# Patient Record
Sex: Female | Born: 1970 | Race: White | Hispanic: No | Marital: Married | Smoking: Never smoker
Health system: Southern US, Community
[De-identification: ages and names within clinical notes are randomized; demographics above are authoritative.]

## PROBLEM LIST (undated history)

## (undated) ENCOUNTER — Emergency Department (HOSPITAL_BASED_OUTPATIENT_CLINIC_OR_DEPARTMENT_OTHER): Payer: BC Managed Care – PPO

## (undated) DIAGNOSIS — R002 Palpitations: Secondary | ICD-10-CM

## (undated) DIAGNOSIS — C801 Malignant (primary) neoplasm, unspecified: Secondary | ICD-10-CM

## (undated) HISTORY — PX: BREAST SURGERY: SHX581

## (undated) HISTORY — PX: TONSILLECTOMY: SUR1361

## (undated) HISTORY — PX: AUGMENTATION MAMMAPLASTY: SUR837

## (undated) HISTORY — DX: Palpitations: R00.2

---

## 2008-01-30 ENCOUNTER — Ambulatory Visit: Payer: Self-pay | Admitting: Hematology

## 2008-09-23 ENCOUNTER — Encounter: Admission: RE | Admit: 2008-09-23 | Discharge: 2008-09-23 | Payer: Self-pay | Admitting: Obstetrics and Gynecology

## 2008-09-26 ENCOUNTER — Encounter: Admission: RE | Admit: 2008-09-26 | Discharge: 2008-09-26 | Payer: Self-pay | Admitting: Obstetrics and Gynecology

## 2008-11-21 ENCOUNTER — Emergency Department (HOSPITAL_COMMUNITY): Admission: EM | Admit: 2008-11-21 | Discharge: 2008-11-21 | Payer: Self-pay | Admitting: Emergency Medicine

## 2009-12-17 ENCOUNTER — Encounter: Admission: RE | Admit: 2009-12-17 | Discharge: 2009-12-17 | Payer: Self-pay | Admitting: Obstetrics and Gynecology

## 2010-05-02 ENCOUNTER — Encounter: Payer: Self-pay | Admitting: Obstetrics and Gynecology

## 2010-06-20 IMAGING — MG MM DIGITAL SCREENING IMPLANTS W/ CAD
8 series · 8 of 8 positions shown · non-contrast
Comparison: none

DG SCREENING W/IMPLANTS
Bilateral CC and MLO view(s) were taken.

DIGITAL SCREENING MAMMOGRAM W/IMPLANTS WITH CAD:
Saline implants are present.  Standard and modified compression views are obtained.
The breast tissue is extremely dense.  No dominant masses or malignant type calcifications are 
identified.  Compared with prior studies.
Images were processed with CAD.

[R CC]
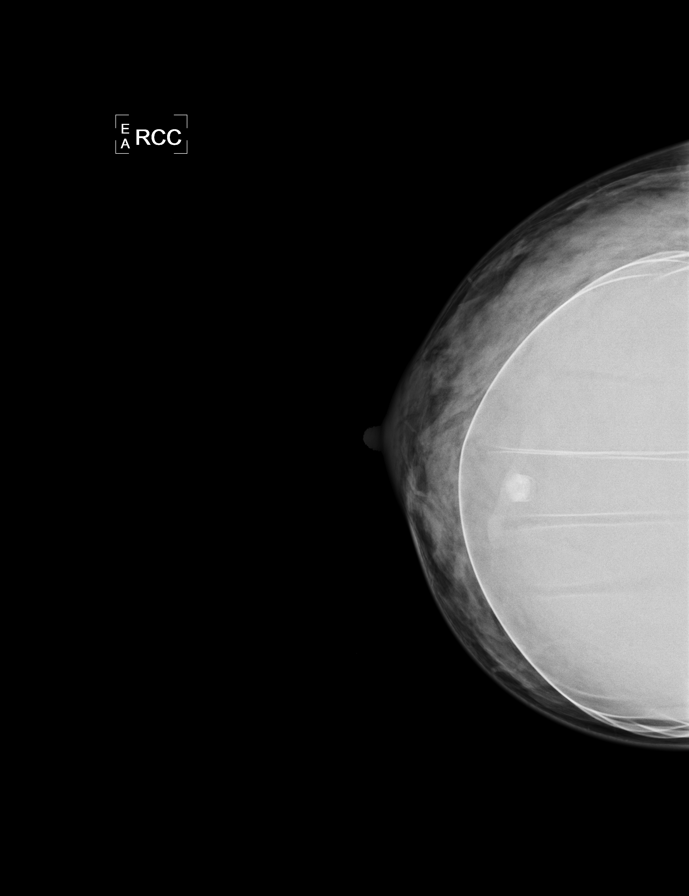

[L CC]
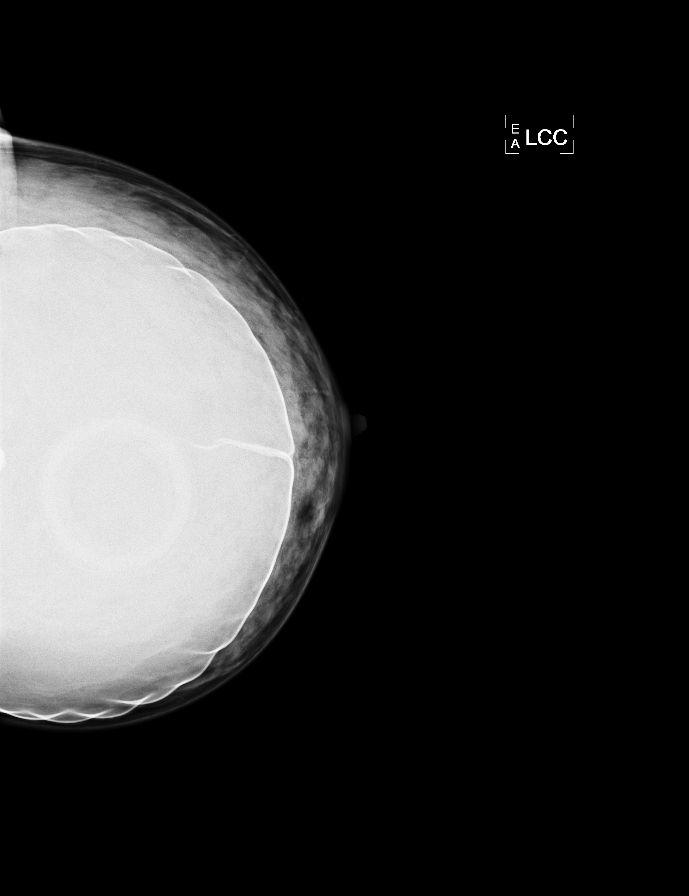

[L MLO]
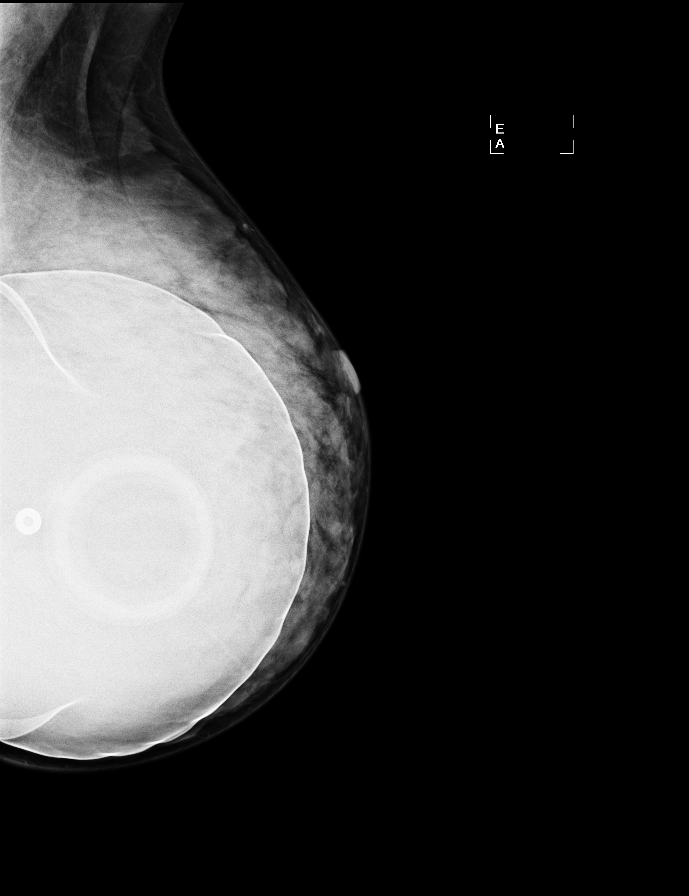

[R MLO]
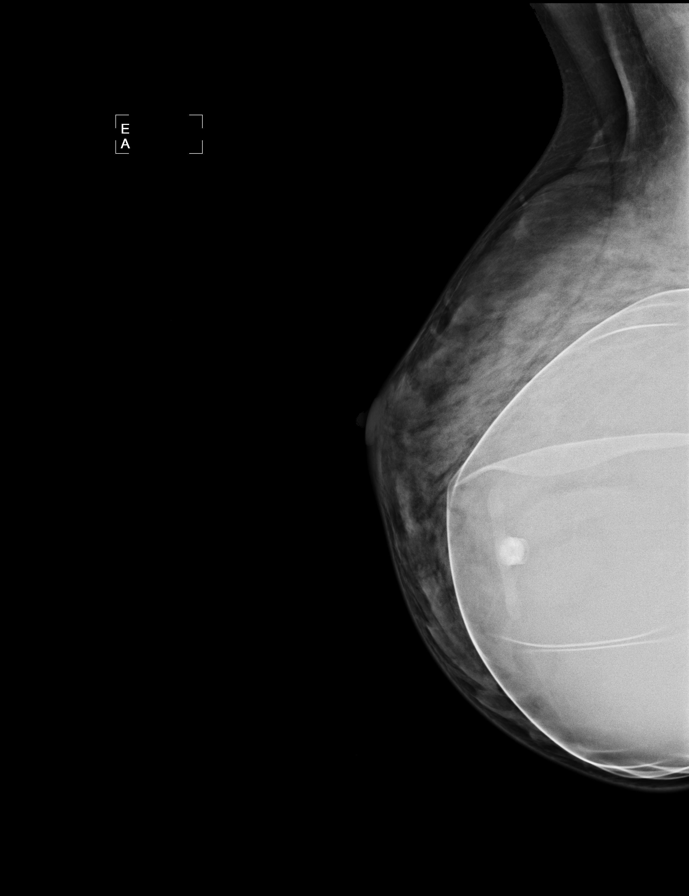

[R CCID]
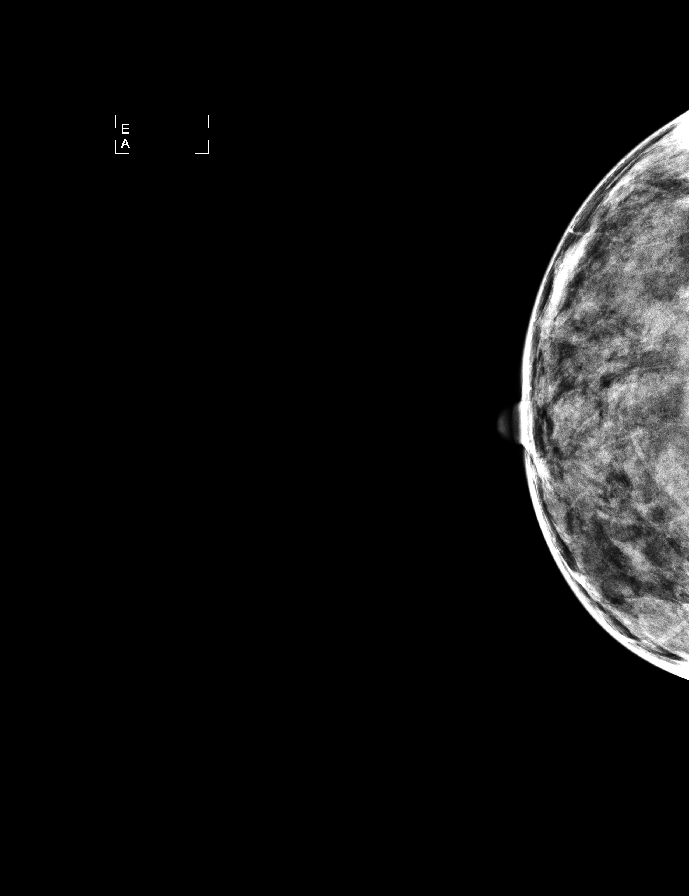

[L CCID]
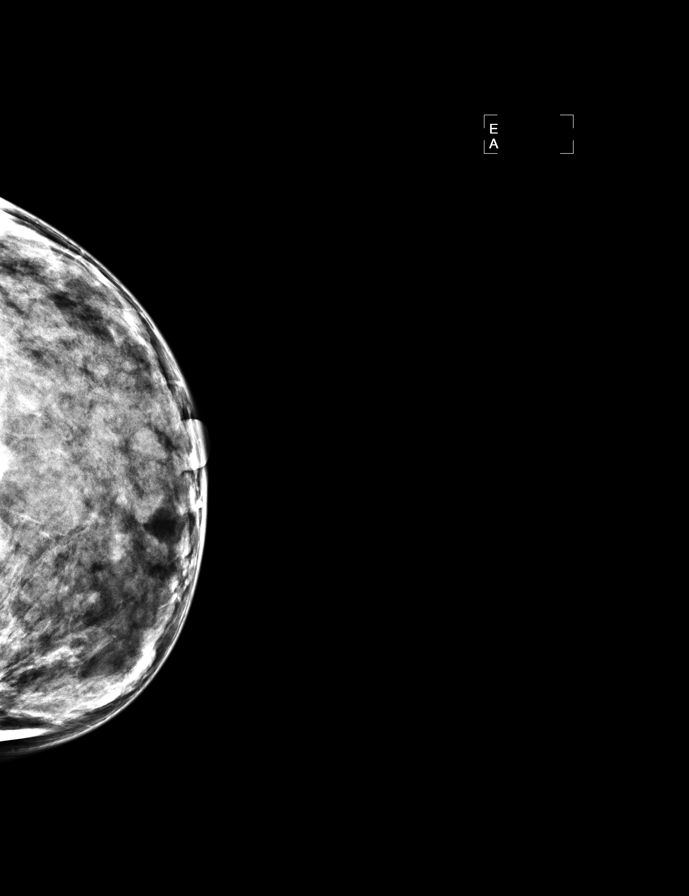

[L MLOID]
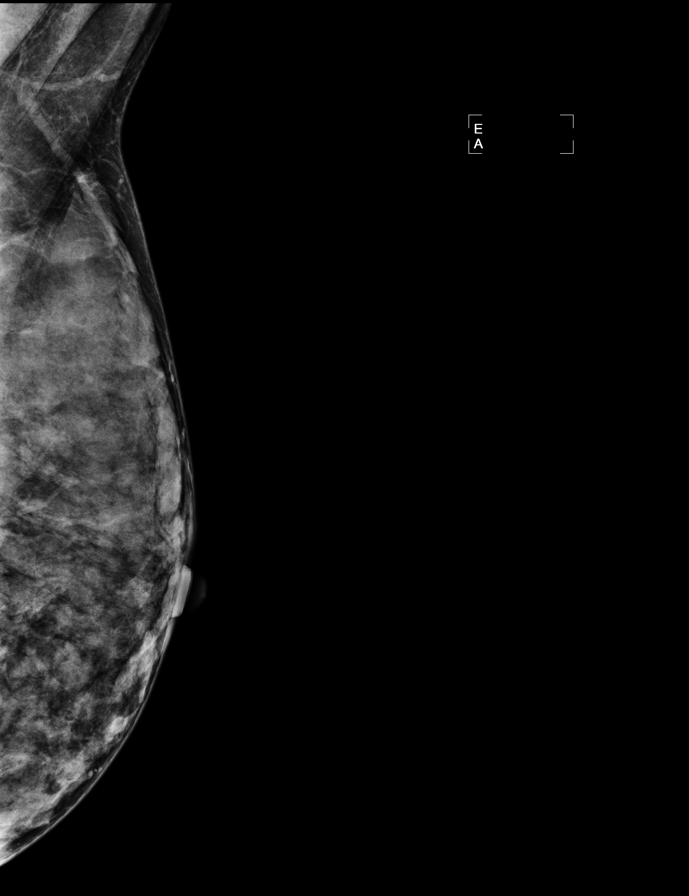

[R MLOID]
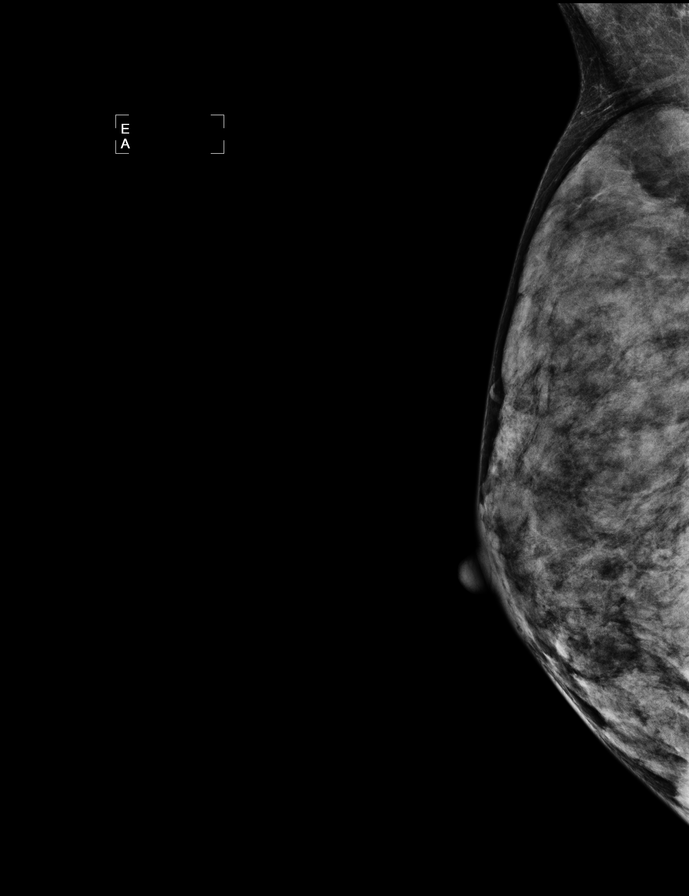

[8 of 8 positions shown; findings below may reference images not displayed]

IMPRESSION: No specific mammographic evidence of malignancy.  Next screening mammogram is recommended in one 
year.

A result letter of this screening mammogram will be mailed directly to the patient.

ASSESSMENT: Negative - BI-RADS 1

Screening mammogram in 1 year.
ANALYZED BY COMPUTER AIDED DETECTION. , THIS PROCEDURE WAS A DIGITAL MAMMOGRAM.

## 2010-07-18 LAB — POCT URINALYSIS DIP (DEVICE)
Bilirubin Urine: NEGATIVE
Nitrite: NEGATIVE
Protein, ur: NEGATIVE mg/dL
Urobilinogen, UA: 0.2 mg/dL (ref 0.0–1.0)
pH: 6.5 (ref 5.0–8.0)

## 2010-12-20 ENCOUNTER — Other Ambulatory Visit: Payer: Self-pay | Admitting: Obstetrics and Gynecology

## 2010-12-20 DIAGNOSIS — Z1231 Encounter for screening mammogram for malignant neoplasm of breast: Secondary | ICD-10-CM

## 2011-01-18 ENCOUNTER — Ambulatory Visit: Payer: Self-pay

## 2011-01-19 ENCOUNTER — Ambulatory Visit
Admission: RE | Admit: 2011-01-19 | Discharge: 2011-01-19 | Disposition: A | Discharge status: Home / Self Care | Payer: BC Managed Care – PPO | Source: Ambulatory Visit | Attending: Obstetrics and Gynecology | Admitting: Obstetrics and Gynecology

## 2011-01-19 DIAGNOSIS — Z1231 Encounter for screening mammogram for malignant neoplasm of breast: Secondary | ICD-10-CM

## 2011-12-19 ENCOUNTER — Other Ambulatory Visit: Payer: Self-pay | Admitting: Obstetrics and Gynecology

## 2011-12-19 DIAGNOSIS — Z1231 Encounter for screening mammogram for malignant neoplasm of breast: Secondary | ICD-10-CM

## 2012-02-07 ENCOUNTER — Ambulatory Visit
Admission: RE | Admit: 2012-02-07 | Discharge: 2012-02-07 | Disposition: A | Discharge status: Home / Self Care | Payer: BC Managed Care – PPO | Source: Ambulatory Visit | Attending: Obstetrics and Gynecology | Admitting: Obstetrics and Gynecology

## 2012-02-07 DIAGNOSIS — Z1231 Encounter for screening mammogram for malignant neoplasm of breast: Secondary | ICD-10-CM

## 2012-07-28 ENCOUNTER — Emergency Department (HOSPITAL_COMMUNITY)
Admission: EM | Admit: 2012-07-28 | Discharge: 2012-07-28 | Disposition: A | Discharge status: Home / Self Care | Payer: BC Managed Care – PPO | Attending: Emergency Medicine | Admitting: Emergency Medicine

## 2012-07-28 ENCOUNTER — Encounter (HOSPITAL_COMMUNITY): Payer: Self-pay | Admitting: *Deleted

## 2012-07-28 DIAGNOSIS — R002 Palpitations: Secondary | ICD-10-CM | POA: Insufficient documentation

## 2012-07-28 DIAGNOSIS — Z3202 Encounter for pregnancy test, result negative: Secondary | ICD-10-CM | POA: Insufficient documentation

## 2012-07-28 DIAGNOSIS — Z79899 Other long term (current) drug therapy: Secondary | ICD-10-CM | POA: Insufficient documentation

## 2012-07-28 DIAGNOSIS — I493 Ventricular premature depolarization: Secondary | ICD-10-CM

## 2012-07-28 DIAGNOSIS — I4949 Other premature depolarization: Secondary | ICD-10-CM | POA: Insufficient documentation

## 2012-07-28 LAB — POCT PREGNANCY, URINE: Preg Test, Ur: NEGATIVE

## 2012-07-28 LAB — CBC WITH DIFFERENTIAL/PLATELET
Basophils Relative: 1 % (ref 0–1)
Eosinophils Absolute: 0 10*3/uL (ref 0.0–0.7)
Eosinophils Relative: 0 % (ref 0–5)
MCH: 30 pg (ref 26.0–34.0)
MCHC: 33.3 g/dL (ref 30.0–36.0)
Neutrophils Relative %: 65 % (ref 43–77)
Platelets: 229 10*3/uL (ref 150–400)
RDW: 13.4 % (ref 11.5–15.5)

## 2012-07-28 LAB — COMPREHENSIVE METABOLIC PANEL
ALT: 18 U/L (ref 0–35)
Albumin: 4 g/dL (ref 3.5–5.2)
Alkaline Phosphatase: 52 U/L (ref 39–117)
Calcium: 9.3 mg/dL (ref 8.4–10.5)
Potassium: 4.1 mEq/L (ref 3.5–5.1)
Sodium: 140 mEq/L (ref 135–145)
Total Protein: 6.7 g/dL (ref 6.0–8.3)

## 2012-07-28 LAB — URINALYSIS, ROUTINE W REFLEX MICROSCOPIC
Glucose, UA: NEGATIVE mg/dL
Ketones, ur: 15 mg/dL — AB
Leukocytes, UA: NEGATIVE
Nitrite: NEGATIVE
Protein, ur: NEGATIVE mg/dL

## 2012-07-28 LAB — MAGNESIUM: Magnesium: 2 mg/dL (ref 1.5–2.5)

## 2012-07-28 LAB — ETHANOL: Alcohol, Ethyl (B): <11 mg/dL (ref 0–11)

## 2012-07-28 MED ORDER — METOPROLOL TARTRATE 12.5 MG HALF TABLET: 12.5000 mg | ORAL_TABLET | Freq: Two times a day (BID) | ORAL | Status: DC

## 2012-07-28 NOTE — ED Notes (Signed)
Pt here via ems for rapid HR.  [employer] originally had HR of 120.  When EMS arrived HR was 95 with occasional pvc's.  Pt non-diaphoretic and denies nausea.  C/o R arm numbness that radiates to r neck along with light-headedness when HR is fast.

## 2012-07-28 NOTE — ED Notes (Signed)
Pt c/o becoming dizzy, and that she is going to faint.  HR increased to 103, but no other vs changed.  No increase in pvc's.

## 2012-07-28 NOTE — ED Provider Notes (Signed)
History     CSN: [id number]  Arrival date & time 07/28/12  1309   First MD Initiated Contact with Patient 07/28/12 1321      Chief Complaint  Patient presents with  . Tachycardia    (Consider location/radiation/quality/duration/timing/severity/associated sxs/prior treatment) HPI Comments: 42 year old female with no significant past medical history presents with a complaint of palpitations. The patient states that this morning she had an increase in her rate to approximately 120, this was checked by paramedics but at that time her pulse was 95 with occasional PVCs. She denies any chest pain nausea vomiting shortness of breath or coughing but does complain of a slight pins and needles sensation between her right shoulder and her right elbow on the medial posterior surface of her upper arm on the right. She denies any weakness of her arms or legs, no abdominal pain, and no fevers chills or diarrhea. She states that she uses several different vitamins from a vitamin company, also uses occasional caffeine and had an increase or alcohol consumption last night about her normal 2 glasses of wine with dinner routine. She denies other stimulant use including cocaine or other illegal substances. At this time her symptoms are intermittent, occurring about every 30-40 seconds, last for several seconds and then resolves. At one point this morning her palpitations became prolonged and gave her a feeling of near syncope but that resolved\ spontaneously and has not recurred again. She has no history of cardiac disease or arrhythmia  The history is provided by the patient and the spouse.    History reviewed. No pertinent past medical history.  Past Surgical History  Procedure Laterality Date  . Cesarean section    . Tonsillectomy    . Breast surgery      implants    No family history on file.  History  Substance Use Topics  . Smoking status: Never Smoker   . Smokeless tobacco: Not on file  .  Alcohol Use: 8.4 oz/week    14 Glasses of wine per week    OB History   Grav Para Term Preterm Abortions TAB SAB Ect Mult Living                  Review of Systems  All other systems reviewed and are negative.    Allergies  Mustard seed  Home Medications   Current Outpatient Rx  Name  Route  Sig  Dispense  Refill  . cycloSPORINE (RESTASIS) 0.05 % ophthalmic emulsion   Both Eyes   Place 1 drop into both eyes daily.         . Multiple Vitamin (MULTIVITAMIN) LIQD   Oral   Take 5 mLs by mouth daily.         . Multiple Vitamins-Minerals (MULTIVITAMIN WITH MINERALS) tablet   Oral   Take 1 tablet by mouth daily. Hair Skin and Nail Vitamin         . PRESCRIPTION MEDICATION   Oral   Take 2 capsules by mouth 2 (two) times daily. Corticare B (Compounded Vitamin from [facility])         . PRESCRIPTION MEDICATION   Intramuscular   Inject 1 vial into the muscle every 30 (thirty) days. Testosterone injection, [facility] at [facility], given at physicians office         . RABEprazole (ACIPHEX) 20 MG tablet   Oral   Take 20 mg by mouth daily.         Marland Kitchen tretinoin (RETIN-A)  0.1 % cream   Topical   Apply 1 application topically at bedtime.         . metoprolol (LOPRESSOR) 12.5 mg TABS   Oral   Take 0.5 tablets (12.5 mg total) by mouth 2 (two) times daily.   60 tablet   1     BP 133/85  Pulse 86  Temp(Src) 98.3 F (36.8 C) (Oral)  Resp 9  Ht 5' 1.5" (1.562 m)  Wt 105 lb (47.628 kg)  BMI 19.52 kg/m2  SpO2 100%  LMP 07/28/2012  Physical Exam  Nursing note and vitals reviewed. Constitutional: She appears well-developed and well-nourished. No distress.  HENT:  Head: Normocephalic and atraumatic.  Mouth/Throat: Oropharynx is clear and moist. No oropharyngeal exudate.  Eyes: Conjunctivae and EOM are normal. Pupils are equal, round, and reactive to light. Right eye exhibits no discharge. Left eye exhibits no discharge. No scleral icterus.   Neck: Normal range of motion. Neck supple. No JVD present. No thyromegaly present.  Cardiovascular: Regular rhythm, normal heart sounds and intact distal pulses.  Exam reveals no gallop and no friction rub.   No murmur heard. Occasional ectopy, strong peripheral pulses at the radial arteries, normal capillary refill, no JVD  Pulmonary/Chest: Effort normal and breath sounds normal. No respiratory distress. She has no wheezes. She has no rales.  Abdominal: Soft. Bowel sounds are normal. She exhibits no distension and no mass. There is no tenderness.  Musculoskeletal: Normal range of motion. She exhibits no edema and no tenderness.  Lymphadenopathy:    She has no cervical adenopathy.  Neurological: She is alert. Coordination normal.  Skin: Skin is warm and dry. No rash noted. No erythema.  Psychiatric: She has a normal mood and affect. Her behavior is normal.    ED Course  Procedures (including critical care time)  Labs Reviewed  COMPREHENSIVE METABOLIC PANEL - Abnormal; Notable for the following:    Glucose, Bld 110 (*)    All other components within normal limits  URINALYSIS, ROUTINE W REFLEX MICROSCOPIC - Abnormal; Notable for the following:    Hgb urine dipstick TRACE (*)    Ketones, ur 15 (*)    All other components within normal limits  URINE MICROSCOPIC-ADD ON - Abnormal; Notable for the following:    Squamous Epithelial / LPF FEW (*)    All other components within normal limits  CBC WITH DIFFERENTIAL  MAGNESIUM  ETHANOL  TSH  POCT PREGNANCY, URINE   No results found.   1. Premature ventricular contractions   2. Palpitations       MDM  *The patient is well-appearing, has no signs of poor perfusion or significant arrhythmia, EKG verifies a single ectopic beat consistent with PVCs otherwise no signs of WPW, no delta wave, the patient does exercise frequently and is able to run as well as engage in weightlifting without any complaints for the majority of her life. Will  check labs, potassium, magnesium, TSH, react monitoring  ED ECG REPORT  I personally interpreted this EKG   Date: 07/28/2012   Rate: EIGHTY ONE  Rhythm: normal sinus rhythm and premature ventricular contractions (PVC)  QRS Axis: normal  Intervals: normal  ST/T Wave abnormalities: normal  Conduction Disutrbances:none  Narrative Interpretation:   Old EKG Reviewed: none available   Laboratory workup unremarkable, patient has not been tachycardic while here, continues to have occasional PVCs, discussed with cardiology who will arrange outpatient followup for the patient and has recommended 12.5 mg of metoprolol twice daily. Patient informed of  treatment plan, stable for discharge.      Vida Roller, MD 07/28/12 [phone]

## 2012-08-02 ENCOUNTER — Encounter (HOSPITAL_COMMUNITY): Payer: Self-pay | Admitting: *Deleted

## 2012-08-02 ENCOUNTER — Emergency Department (HOSPITAL_COMMUNITY)
Admission: EM | Admit: 2012-08-02 | Discharge: 2012-08-02 | Disposition: A | Discharge status: Home / Self Care | Payer: BC Managed Care – PPO | Attending: Emergency Medicine | Admitting: Emergency Medicine

## 2012-08-02 DIAGNOSIS — R002 Palpitations: Secondary | ICD-10-CM

## 2012-08-02 DIAGNOSIS — Z79899 Other long term (current) drug therapy: Secondary | ICD-10-CM | POA: Insufficient documentation

## 2012-08-02 DIAGNOSIS — I4949 Other premature depolarization: Secondary | ICD-10-CM | POA: Insufficient documentation

## 2012-08-02 DIAGNOSIS — E869 Volume depletion, unspecified: Secondary | ICD-10-CM | POA: Insufficient documentation

## 2012-08-02 MED ORDER — SODIUM CHLORIDE 0.9 % IV BOLUS (SEPSIS)
2000.0000 mL | Freq: Once | INTRAVENOUS | Status: AC
  Administered 2012-08-02: 1000 mL via INTRAVENOUS

## 2012-08-02 NOTE — ED Notes (Signed)
Patient with heart palpitations/fluttering.  Patient was seen here a few days ago for same symptoms

## 2012-08-02 NOTE — ED Provider Notes (Signed)
History     CSN: [id number]  Arrival date & time 08/02/12  0757   First MD Initiated Contact with Patient 08/02/12 0800      Chief Complaint  Patient presents with  . Palpitations    The history is provided by the patient and medical records.   patient reports developing palpitations this morning.  She felt as though she had increased palpitations and fluttering when she was walking up stairs in her house and walking around the house.  She does report having 2 glasses of wine last night but this is not out of the ordinary for her.  She was seen emergency room several days ago for similar symptoms.  At that time she had a complete workup by Dr. Hyacinth Meeker including thyroid studies labs and an EKG.  She was started at that time on what sounds like low-dose beta blocker but the patient states it made her feel miserable and therefore she did not continue this.  She has a followup appointment with cardiology however when her symptoms returned today she felt as though his best to come emergency apartment for repeat evaluation.  On arrival to [facility] the patient states she's feeling better and only has occasional palpitations.  The patient is now to have a few premature ventricular contractions while in the emergency department.  There is one captured by EKG.  The patient reports normal oral intake.  She exercises regularly and never has chest pain or palpitations during exercise.  She denies drug use or abuse.  Her thyroid studies were reviewed from her prior ER visit and are normal.  History reviewed. No pertinent past medical history.  Past Surgical History  Procedure Laterality Date  . Cesarean section    . Tonsillectomy    . Breast surgery      implants    History reviewed. No pertinent family history.  History  Substance Use Topics  . Smoking status: Never Smoker   . Smokeless tobacco: Not on file  . Alcohol Use: 8.4 oz/week    14 Glasses of wine per week    OB History   Grav  Para Term Preterm Abortions TAB SAB Ect Mult Living                  Review of Systems  All other systems reviewed and are negative.    Allergies  Mustard seed  Home Medications   Current Outpatient Rx  Name  Route  Sig  Dispense  Refill  . cetirizine-pseudoephedrine (ZYRTEC-D) 5-120 MG per tablet   Oral   Take 1 tablet by mouth daily as needed for allergies.         . cycloSPORINE (RESTASIS) 0.05 % ophthalmic emulsion   Both Eyes   Place 1 drop into both eyes daily as needed (for eyes).          . Multiple Vitamin (MULTIVITAMIN WITH MINERALS) TABS   Oral   Take 1 tablet by mouth daily as needed (for vitamin).         . RABEprazole (ACIPHEX) 20 MG tablet   Oral   Take 20 mg by mouth daily.         Marland Kitchen tretinoin (RETIN-A) 0.1 % cream   Topical   Apply 1 application topically at bedtime as needed (for face).          . metoprolol (LOPRESSOR) 12.5 mg TABS   Oral   Take 0.5 tablets (12.5 mg total) by mouth 2 (two) times daily.  60 tablet   1   . PRESCRIPTION MEDICATION   Intramuscular   Inject 1 vial into the muscle every 30 (thirty) days. Testosterone injection, [facility], given at physicians office           BP 124/69  Pulse 80  Temp(Src) 97.5 F (36.4 C) (Oral)  Resp 17  SpO2 100%  LMP 07/28/2012  Physical Exam  Nursing note and vitals reviewed. Constitutional: She is oriented to person, place, and time. She appears well-developed and well-nourished. No distress.  HENT:  Head: Normocephalic and atraumatic.  Eyes: EOM are normal.  Neck: Normal range of motion.  Cardiovascular: Normal rate, regular rhythm and normal heart sounds.   Pulmonary/Chest: Effort normal and breath sounds normal.  Abdominal: Soft. She exhibits no distension. There is no tenderness.  Musculoskeletal: Normal range of motion.  Neurological: She is alert and oriented to person, place, and time.  Skin: Skin is warm and dry.  Psychiatric: She has a  normal mood and affect. Judgment normal.    ED Course  Procedures (including critical care time)   Date: 08/05/2012  Rate: 73  Rhythm: normal sinus rhythm  QRS Axis: normal  Intervals: normal  ST/T Wave abnormalities: normal  Conduction Disutrbances: single PVC noted  Narrative Interpretation:   Old EKG Reviewed: No significant changes noted     Labs Reviewed - No data to display No results found.   1. Palpitations   2. Volume depletion       MDM  Patient ambulated in the emergency department after ambulating her heart rate went up to around 100 and she had increased number of mature ventricular contractions.  No malignant arrhythmias noted.  Facial be given IV fluids as I suspect she is slightly volume depleted.  I was able to call the cardiology clinic and set up a Holter next week. Follow up with Dr Eden Emms on May 1st which is an earlier visit and she was scheduled for a period  10:08 AM Feels much better walking around after fluids. No PVCs at this time.     Lyanne Co, MD 08/05/12 [phone]

## 2012-08-02 NOTE — ED Notes (Signed)
MD at bedside with patient

## 2012-08-06 ENCOUNTER — Ambulatory Visit (INDEPENDENT_AMBULATORY_CARE_PROVIDER_SITE_OTHER): Payer: BC Managed Care – PPO

## 2012-08-06 DIAGNOSIS — I4949 Other premature depolarization: Secondary | ICD-10-CM

## 2012-08-06 DIAGNOSIS — R002 Palpitations: Secondary | ICD-10-CM

## 2012-08-06 NOTE — Progress Notes (Signed)
Kelsey Archer placed a 48 hr holter

## 2012-08-08 ENCOUNTER — Encounter: Payer: Self-pay | Admitting: *Deleted

## 2012-08-08 ENCOUNTER — Encounter: Payer: Self-pay | Admitting: Cardiovascular Disease

## 2012-08-09 ENCOUNTER — Ambulatory Visit (INDEPENDENT_AMBULATORY_CARE_PROVIDER_SITE_OTHER): Payer: BC Managed Care – PPO | Admitting: Cardiovascular Disease

## 2012-08-09 VITALS — BP 108/78 | HR 77 | Wt 105.0 lb

## 2012-08-09 DIAGNOSIS — R002 Palpitations: Secondary | ICD-10-CM

## 2012-08-09 MED ORDER — PROPRANOLOL HCL 10 MG PO TABS: 10.0000 mg | ORAL_TABLET | Freq: Three times a day (TID) | ORAL | Status: DC

## 2012-08-09 MED ORDER — PROPRANOLOL HCL 10 MG PO TABS: 10.0000 mg | ORAL_TABLET | ORAL | Status: DC | PRN

## 2012-08-09 NOTE — Progress Notes (Signed)
Patient ID: Kelsey Archer, female   DOB: [birth year], 42 y.o.   MRN: [id number] 42 yo seen in ER 4/19 and 4/24 with palpitations.  She felt as though she had increased palpitations and fluttering when she was walking up stairs in her house and walking around the house. She does report having 2 glasses of wine 24th  but this is not out of the ordinary for her. She was seen emergency room several days ago for similar symptoms. At that time she had a complete workup by Dr. Hyacinth Archer including thyroid studies labs and an EKG. She was started at that time on what sounds like low-dose beta blocker but the patient states it made her feel miserable and therefore she did not continue this. She has a followup appointment with cardiology however when her symptoms returned today she felt as though his best to come emergency apartment for repeat evaluation. On arrival to merge pertinent the patient states she's feeling better and only has occasional palpitations. The patient is now to have a few premature ventricular contractions while in the emergency department. There is one captured by EKG. The patient reports normal oral intake. She exercises regularly and never has chest pain or palpitations during exercise. She denies drug use or abuse. Her thyroid studies were reviewed from her prior ER visit and are normal.  Was on testosterone / estrogen type hormonal replacement but stopped Discussed daily wine intake  ROS: Denies fever, malais, weight loss, blurry vision, decreased visual acuity, cough, sputum, SOB, hemoptysis, pleuritic pain, palpitaitons, heartburn, abdominal pain, melena, lower extremity edema, claudication, or rash.  All other systems reviewed and negative   General: Affect appropriate Healthy:  appears stated age HEENT: normal Neck supple with no adenopathy JVP normal no bruits no thyromegaly Lungs clear with no wheezing and good diaphragmatic motion Heart:  S1/S2 no murmur,rub, gallop or click PMI  normal Abdomen: benighn, BS positve, no tenderness, no AAA no bruit.  No HSM or HJR Distal pulses intact with no bruits No edema Neuro non-focal Skin warm and dry No muscular weakness  Medications Current Outpatient Prescriptions  Medication Sig Dispense Refill  . cetirizine-pseudoephedrine (ZYRTEC-D) 5-120 MG per tablet Take 1 tablet by mouth daily as needed for allergies.      . RABEprazole (ACIPHEX) 20 MG tablet Take 20 mg by mouth daily.      Marland Kitchen tretinoin (RETIN-A) 0.1 % cream Apply 1 application topically at bedtime as needed (for face).        No current facility-administered medications for this visit.    Allergies Mustard seed  Family History: No family history on file.  Social History: History   Social History  . Marital Status: Married    Spouse Name: N/A    Number of Children: N/A  . Years of Education: N/A   Occupational History  . Not on file.   Social History Main Topics  . Smoking status: Never Smoker   . Smokeless tobacco: Not on file  . Alcohol Use: 8.4 oz/week    14 Glasses of wine per week  . Drug Use: No  . Sexually Active: Not on file   Other Topics Concern  . Not on file   Social History Narrative  . No narrative on file    Electrocardiogram:  07/28/12 SR rate 81 normal QT 392  Assessment and Plan

## 2012-08-09 NOTE — Patient Instructions (Signed)
Your physician recommends that you schedule a follow-up appointment in  AS NEEDED  Your physician has recommended you make the following change in your medication:  ADD INDERAL 10 MG 1 TAB AS NEEDED FOR PALPITATIONS  Your physician has requested that you have a stress echocardiogram. For further information please visit [email or web address] Please follow instruction sheet as given.

## 2012-08-09 NOTE — Assessment & Plan Note (Signed)
Likely benign No high risk features. F/U stress echo and will review monitor when available. PRN inderal 5 mg called in.   Ok to exercise.  Stress management discussed.  Wine intake discussed

## 2012-08-21 ENCOUNTER — Other Ambulatory Visit (HOSPITAL_COMMUNITY): Payer: BC Managed Care – PPO

## 2012-08-21 ENCOUNTER — Institutional Professional Consult (permissible substitution): Payer: BC Managed Care – PPO | Admitting: Cardiology

## 2012-08-31 ENCOUNTER — Encounter: Payer: Self-pay | Admitting: Cardiovascular Disease

## 2012-08-31 ENCOUNTER — Ambulatory Visit (HOSPITAL_COMMUNITY): Payer: BC Managed Care – PPO | Attending: Cardiology

## 2012-08-31 ENCOUNTER — Ambulatory Visit (HOSPITAL_BASED_OUTPATIENT_CLINIC_OR_DEPARTMENT_OTHER): Payer: BC Managed Care – PPO | Admitting: Radiology

## 2012-08-31 DIAGNOSIS — R079 Chest pain, unspecified: Secondary | ICD-10-CM | POA: Insufficient documentation

## 2012-08-31 DIAGNOSIS — R002 Palpitations: Secondary | ICD-10-CM | POA: Insufficient documentation

## 2012-08-31 DIAGNOSIS — R0989 Other specified symptoms and signs involving the circulatory and respiratory systems: Secondary | ICD-10-CM

## 2012-08-31 DIAGNOSIS — R55 Syncope and collapse: Secondary | ICD-10-CM | POA: Insufficient documentation

## 2012-08-31 NOTE — Progress Notes (Signed)
Stress Echocardiogram performed.  

## 2012-09-04 ENCOUNTER — Telehealth: Payer: Self-pay | Admitting: Nurse Practitioner

## 2012-09-04 NOTE — Telephone Encounter (Signed)
I found patient's holter monitor results which Dr. Eden Emms had signed off on and which show SR with isolated PVC's; no significant arrhythmia.  Patient verbalized understanding. After reporting results to patient I placed the report in Holter Room box.

## 2013-01-08 ENCOUNTER — Other Ambulatory Visit: Payer: Self-pay

## 2013-01-08 DIAGNOSIS — Z1231 Encounter for screening mammogram for malignant neoplasm of breast: Secondary | ICD-10-CM

## 2013-01-23 ENCOUNTER — Encounter (HOSPITAL_COMMUNITY): Payer: Self-pay | Admitting: Emergency Medicine

## 2013-01-23 ENCOUNTER — Emergency Department (HOSPITAL_COMMUNITY)
Admission: EM | Admit: 2013-01-23 | Discharge: 2013-01-24 | Disposition: A | Discharge status: Home / Self Care | Payer: BC Managed Care – PPO | Attending: Emergency Medicine | Admitting: Emergency Medicine

## 2013-01-23 ENCOUNTER — Emergency Department (HOSPITAL_COMMUNITY): Payer: BC Managed Care – PPO

## 2013-01-23 DIAGNOSIS — R1110 Vomiting, unspecified: Secondary | ICD-10-CM | POA: Insufficient documentation

## 2013-01-23 DIAGNOSIS — Z3202 Encounter for pregnancy test, result negative: Secondary | ICD-10-CM | POA: Insufficient documentation

## 2013-01-23 DIAGNOSIS — R002 Palpitations: Secondary | ICD-10-CM | POA: Insufficient documentation

## 2013-01-23 DIAGNOSIS — Z79899 Other long term (current) drug therapy: Secondary | ICD-10-CM | POA: Insufficient documentation

## 2013-01-23 DIAGNOSIS — R55 Syncope and collapse: Secondary | ICD-10-CM | POA: Insufficient documentation

## 2013-01-23 DIAGNOSIS — R0789 Other chest pain: Secondary | ICD-10-CM | POA: Insufficient documentation

## 2013-01-23 DIAGNOSIS — R42 Dizziness and giddiness: Secondary | ICD-10-CM | POA: Insufficient documentation

## 2013-01-23 DIAGNOSIS — R61 Generalized hyperhidrosis: Secondary | ICD-10-CM | POA: Insufficient documentation

## 2013-01-23 LAB — BASIC METABOLIC PANEL
CO2: 26 mEq/L (ref 19–32)
Chloride: 102 mEq/L (ref 96–112)
Glucose, Bld: 117 mg/dL — ABNORMAL HIGH (ref 70–99)
Potassium: 4 mEq/L (ref 3.5–5.1)
Sodium: 137 mEq/L (ref 135–145)

## 2013-01-23 LAB — CBC
Hemoglobin: 12.2 g/dL (ref 12.0–15.0)
MCH: 30.6 pg (ref 26.0–34.0)
RBC: 3.99 MIL/uL (ref 3.87–5.11)

## 2013-01-23 LAB — POCT I-STAT TROPONIN I

## 2013-01-23 MED ORDER — SODIUM CHLORIDE 0.9 % IV BOLUS (SEPSIS)
1000.0000 mL | Freq: Once | INTRAVENOUS | Status: AC
  Administered 2013-01-24: 1000 mL via INTRAVENOUS

## 2013-01-23 NOTE — ED Provider Notes (Signed)
TIME SEEN: 11:54 PM  CHIEF COMPLAINT: Chest pain, palpitations, near syncope  HPI: Patient is a 42 year old female with no significant past medical history presents emergency department with 3 days of intermittent chest pressure, palpitations and lightheadedness. She denies any aggravating or alleviating factors. She has had one episode of vomiting and one episode of diaphoresis. She had similar symptoms approximately one year ago was seen by a cardiologist and had a normal stress test, echocardiogram and/or Holter monitor without any events. She denies that she's had any fever, cough or shortness of breath, lower extremity swelling or pain. No prior history of PE or DVT. No recent hospitalization, prolonged immobilization, fracture, surgery, trauma. Patient is on exogenous progesterone but not on estrogens. No history of hypertension, diabetes, hyperlipidemia, tobacco, family history of premature cardiac disease.  ROS: See HPI Constitutional: no fever  Eyes: no drainage  ENT: no runny nose   Cardiovascular:  chest pain  Resp: no SOB  GI: no vomiting GU: no dysuria Integumentary: no rash  Allergy: no hives  Musculoskeletal: no leg swelling  Neurological: no slurred speech ROS otherwise negative  PAST MEDICAL HISTORY/PAST SURGICAL HISTORY:  Past Medical History  Diagnosis Date  . Palpitations     MEDICATIONS:  Prior to Admission medications   Medication Sig Start Date End Date Taking? Authorizing Provider  cetirizine-pseudoephedrine (ZYRTEC-D) 5-120 MG per tablet Take 1 tablet by mouth daily as needed for allergies.   Yes Historical Provider, MD  Misc Natural Products (PROGESTERONE EX) Apply 1 application topically daily.   Yes Historical Provider, MD  progesterone (PROMETRIUM) 100 MG capsule Take 100 mg by mouth daily.   Yes Historical Provider, MD  RABEprazole (ACIPHEX) 20 MG tablet Take 20 mg by mouth daily.   Yes Historical Provider, MD    ALLERGIES:  Allergies  Allergen  Reactions  . Mustard Seed Nausea And Vomiting    SOCIAL HISTORY:  History  Substance Use Topics  . Smoking status: Never Smoker   . Smokeless tobacco: Not on file  . Alcohol Use: 8.4 oz/week    14 Glasses of wine per week    FAMILY HISTORY: No family history on file.  EXAM: BP 118/73  Pulse 83  Temp(Src) 98 F (36.7 C) (Oral)  Resp 15  Ht 5\' 1"  (1.549 m)  Wt 105 lb (47.628 kg)  BMI 19.85 kg/m2  SpO2 100%  LMP 01/08/2013 CONSTITUTIONAL: Alert and oriented and responds appropriately to questions. Well-appearing; well-nourished HEAD: Normocephalic EYES: Conjunctivae clear, PERRL ENT: normal nose; no rhinorrhea; moist mucous membranes; pharynx without lesions noted NECK: Supple, no meningismus, no LAD  CARD: RRR; S1 and S2 appreciated; no murmurs, no clicks, no rubs, no gallops RESP: Normal chest excursion without splinting or tachypnea; breath sounds clear and equal bilaterally; no wheezes, no rhonchi, no rales,  ABD/GI: Normal bowel sounds; non-distended; soft, non-tender, no rebound, no guarding BACK:  The back appears normal and is non-tender to palpation, there is no CVA tenderness EXT: Normal ROM in all joints; non-tender to palpation; no edema; normal capillary refill; no cyanosis    SKIN: Normal color for age and race; warm NEURO: Moves all extremities equally PSYCH: The patient's mood and manner are appropriate. Grooming and personal hygiene are appropriate.  MEDICAL DECISION MAKING: Patient here with lightheadedness, palpitations and chest tightness. She has no risk factors for ACS or pulmonary embolus. Her vital signs are stable. Her EKG is completely normal other than a slightly short PR interval but there is no delta wave to suggest  WPW. Her electrolytes, blood counts are normal. Will give IV fluids, check orthostatic vital signs and reassess. Have offered admission the patient reports that she would like outpatient followup. We'll check second troponin.  ED  PROGRESS: Second troponin pending. Patient reports feeling much better after IV fluids. Orthostatic vital signs negative.   2:22 AM  Pt troponin is negative. She still hemodynamically stable and is now asymptomatic and feels much better. Have offered admission but patient reports she would like discharge home. Given strict return precautions. Patient and husband verbalize understanding and are comfortable with plan.  Layla Maw Alecia Doi, DO 01/24/13 [phone]

## 2013-01-23 NOTE — ED Notes (Signed)
Pt. reports intermittent mid / upper chest pain " tightness" with nausea and emesis onset Sunday this week with palpitations .

## 2013-01-24 LAB — URINALYSIS, ROUTINE W REFLEX MICROSCOPIC
Leukocytes, UA: NEGATIVE
Nitrite: NEGATIVE
Specific Gravity, Urine: 1.01 (ref 1.005–1.030)
pH: 7 (ref 5.0–8.0)

## 2013-01-24 LAB — POCT I-STAT TROPONIN I: Troponin i, poc: 0.01 ng/mL (ref 0.00–0.08)

## 2013-01-24 MED ORDER — SODIUM CHLORIDE 0.9 % IV BOLUS (SEPSIS)
1000.0000 mL | Freq: Once | INTRAVENOUS | Status: AC
  Administered 2013-01-24: 1000 mL via INTRAVENOUS

## 2013-01-24 NOTE — ED Provider Notes (Signed)
Date: 01/23/13 21:39  Rate: 90  Rhythm: normal sinus rhythm; no ectopy  QRS Axis: normal  Intervals: normal  ST/T Wave abnormalities: normal  Conduction Disutrbances: Short PR interval  Narrative Interpretation: unremarkable; short PR interval, no delta wave, no Brugada, no other arrhythmia      Layla Maw Tiajuana Leppanen, DO 01/24/13 0102

## 2013-02-07 ENCOUNTER — Ambulatory Visit
Admission: RE | Admit: 2013-02-07 | Discharge: 2013-02-07 | Disposition: A | Discharge status: Home / Self Care | Payer: BC Managed Care – PPO | Source: Ambulatory Visit

## 2013-02-07 DIAGNOSIS — Z1231 Encounter for screening mammogram for malignant neoplasm of breast: Secondary | ICD-10-CM

## 2014-01-14 ENCOUNTER — Other Ambulatory Visit: Payer: Self-pay

## 2014-01-14 DIAGNOSIS — Z1231 Encounter for screening mammogram for malignant neoplasm of breast: Secondary | ICD-10-CM

## 2014-06-09 ENCOUNTER — Ambulatory Visit
Admission: RE | Admit: 2014-06-09 | Discharge: 2014-06-09 | Disposition: A | Discharge status: Home / Self Care | Payer: BLUE CROSS/BLUE SHIELD | Source: Ambulatory Visit

## 2014-06-09 DIAGNOSIS — Z1231 Encounter for screening mammogram for malignant neoplasm of breast: Secondary | ICD-10-CM

## 2014-11-24 ENCOUNTER — Ambulatory Visit: Payer: BLUE CROSS/BLUE SHIELD | Admitting: Sports Medicine

## 2015-05-21 ENCOUNTER — Other Ambulatory Visit: Payer: Self-pay

## 2015-05-21 DIAGNOSIS — Z1231 Encounter for screening mammogram for malignant neoplasm of breast: Secondary | ICD-10-CM

## 2015-06-16 ENCOUNTER — Ambulatory Visit
Admission: RE | Admit: 2015-06-16 | Discharge: 2015-06-16 | Disposition: A | Discharge status: Home / Self Care | Payer: BLUE CROSS/BLUE SHIELD | Source: Ambulatory Visit

## 2015-06-16 DIAGNOSIS — Z1231 Encounter for screening mammogram for malignant neoplasm of breast: Secondary | ICD-10-CM

## 2015-06-17 ENCOUNTER — Other Ambulatory Visit: Payer: Self-pay | Admitting: Obstetrics and Gynecology

## 2015-06-17 DIAGNOSIS — Z9189 Other specified personal risk factors, not elsewhere classified: Secondary | ICD-10-CM

## 2015-07-07 ENCOUNTER — Ambulatory Visit
Admission: RE | Admit: 2015-07-07 | Discharge: 2015-07-07 | Disposition: A | Discharge status: Home / Self Care | Payer: BLUE CROSS/BLUE SHIELD | Source: Ambulatory Visit | Attending: Obstetrics and Gynecology | Admitting: Obstetrics and Gynecology

## 2015-07-07 DIAGNOSIS — Z9189 Other specified personal risk factors, not elsewhere classified: Secondary | ICD-10-CM

## 2015-07-07 MED ORDER — GADOBENATE DIMEGLUMINE 529 MG/ML IV SOLN
9.0000 mL | Freq: Once | INTRAVENOUS | Status: AC | PRN
  Administered 2015-07-07: 9 mL via INTRAVENOUS

## 2015-10-02 ENCOUNTER — Encounter: Payer: Self-pay | Admitting: Genetic Counselor

## 2016-01-13 ENCOUNTER — Emergency Department (HOSPITAL_COMMUNITY)
Admission: EM | Admit: 2016-01-13 | Discharge: 2016-01-13 | Disposition: A | Discharge status: Home / Self Care | Payer: BLUE CROSS/BLUE SHIELD | Attending: Emergency Medicine | Admitting: Emergency Medicine

## 2016-01-13 ENCOUNTER — Encounter (HOSPITAL_COMMUNITY): Payer: Self-pay | Admitting: Emergency Medicine

## 2016-01-13 DIAGNOSIS — S60511A Abrasion of right hand, initial encounter: Secondary | ICD-10-CM

## 2016-01-13 DIAGNOSIS — Y999 Unspecified external cause status: Secondary | ICD-10-CM | POA: Insufficient documentation

## 2016-01-13 DIAGNOSIS — S50811A Abrasion of right forearm, initial encounter: Secondary | ICD-10-CM | POA: Insufficient documentation

## 2016-01-13 DIAGNOSIS — Y9289 Other specified places as the place of occurrence of the external cause: Secondary | ICD-10-CM | POA: Diagnosis not present

## 2016-01-13 DIAGNOSIS — W5503XA Scratched by cat, initial encounter: Secondary | ICD-10-CM | POA: Diagnosis not present

## 2016-01-13 DIAGNOSIS — Y939 Activity, unspecified: Secondary | ICD-10-CM | POA: Diagnosis not present

## 2016-01-13 DIAGNOSIS — Z23 Encounter for immunization: Secondary | ICD-10-CM | POA: Diagnosis not present

## 2016-01-13 DIAGNOSIS — S61451A Open bite of right hand, initial encounter: Secondary | ICD-10-CM | POA: Diagnosis present

## 2016-01-13 MED ORDER — TETANUS-DIPHTH-ACELL PERTUSSIS 5-2.5-18.5 LF-MCG/0.5 IM SUSP
0.5000 mL | Freq: Once | INTRAMUSCULAR | Status: AC
  Administered 2016-01-13: 0.5 mL via INTRAMUSCULAR
  Filled 2016-01-13: qty 0.5

## 2016-01-13 NOTE — ED Notes (Signed)
Pt is in stable condition upon d/c and ambulates from ED. 

## 2016-01-13 NOTE — ED Provider Notes (Signed)
[facility] DEPT Provider Note   CSN: [id number] Arrival date & time: 01/13/16  1412     History   Chief Complaint Chief Complaint  Patient presents with  . Animal Bite    HPI Kelsey Archer is a 45 y.o. female.  The history is provided by the patient. No language interpreter was used.  Animal Bite  Contact animal:  Cat Location:  Hand Time since incident:  1 day Pain details:    Quality:  Aching   Severity:  Mild   Timing:  Constant   Progression:  Worsening Incident location:  Home Provoked: provoked   Notifications:  Animal control Animal's rabies vaccination status:  Out of date Animal in possession: yes   Relieved by:  Nothing Worsened by:  Nothing Ineffective treatments:  None tried Associated symptoms: no rash    Pt complains of a scratch or possible bite to arm from a cat bite  Past Medical History:  Diagnosis Date  . Palpitations     Patient Active Problem List   Diagnosis Date Noted  . Palpitation 08/06/2012    Past Surgical History:  Procedure Laterality Date  . BREAST SURGERY     implants  . CESAREAN SECTION    . TONSILLECTOMY      OB History    No data available       Home Medications    Prior to Admission medications   Medication Sig Start Date End Date Taking? Authorizing Provider  cetirizine-pseudoephedrine (ZYRTEC-D) 5-120 MG per tablet Take 1 tablet by mouth daily as needed for allergies.    Historical Provider, MD  Misc Natural Products (PROGESTERONE EX) Apply 1 application topically daily.    Historical Provider, MD  progesterone (PROMETRIUM) 100 MG capsule Take 100 mg by mouth daily.    Historical Provider, MD  RABEprazole (ACIPHEX) 20 MG tablet Take 20 mg by mouth daily.    Historical Provider, MD    Family History No family history on file.  Social History Social History  Substance Use Topics  . Smoking status: Never Smoker  . Smokeless tobacco: Not on file  . Alcohol use 8.4 oz/week    14 Glasses of wine  per week     Allergies   Mustard seed   Review of Systems Review of Systems  Skin: Negative for rash.  All other systems reviewed and are negative.    Physical Exam Updated Vital Signs BP 144/77 (BP Location: Right Arm)   Pulse 101   Temp 98.5 F (36.9 C) (Oral)   LMP 01/08/2013   SpO2 99%   Physical Exam  Constitutional: She is oriented to person, place, and time. She appears well-developed and well-nourished.  HENT:  Head: Normocephalic.  Eyes: EOM are normal.  Neck: Normal range of motion.  Pulmonary/Chest: Effort normal.  Abdominal: She exhibits no distension.  Musculoskeletal: Normal range of motion.  Abrasion right forearm, nv and ns intact  Neurological: She is alert and oriented to person, place, and time.  Psychiatric: She has a normal mood and affect.  Nursing note and vitals reviewed.    ED Treatments / Results  Labs (all labs ordered are listed, but only abnormal results are displayed) Labs Reviewed - No data to display  EKG  EKG Interpretation None       Radiology No results found.  Procedures Procedures (including critical care time)  Medications Ordered in ED Medications  Tdap (BOOSTRIX) injection 0.5 mL (not administered)     Initial Impression / Assessment and  Plan / ED Course  I have reviewed the triage vital signs and the nursing notes.  Pertinent labs & imaging results that were available during my care of the patient were reviewed by me and considered in my medical decision making (see chart for details).  Clinical Course   Animal control has cat.  I explained to pt she does not need shots unless cat becomes sick.    Final Clinical Impressions(s) / ED Diagnoses   Final diagnoses:  Cat scratch of right hand, initial encounter    New Prescriptions New Prescriptions   No medications on file     Fransico Meadow, PA-C 01/13/16 [place], MD 01/18/16 [phone]

## 2016-01-13 NOTE — ED Triage Notes (Signed)
Pt has small laceration/abrasion to left wrist--from cat-- barn cat-- not sure if it is up to date on shots. No swelling.

## 2016-03-18 ENCOUNTER — Ambulatory Visit (INDEPENDENT_AMBULATORY_CARE_PROVIDER_SITE_OTHER): Payer: BLUE CROSS/BLUE SHIELD | Admitting: Orthopaedic Surgery

## 2016-03-18 ENCOUNTER — Encounter (INDEPENDENT_AMBULATORY_CARE_PROVIDER_SITE_OTHER): Payer: Self-pay | Admitting: Orthopaedic Surgery

## 2016-03-18 ENCOUNTER — Ambulatory Visit (INDEPENDENT_AMBULATORY_CARE_PROVIDER_SITE_OTHER): Payer: Self-pay

## 2016-03-18 DIAGNOSIS — M25511 Pain in right shoulder: Secondary | ICD-10-CM

## 2016-03-18 NOTE — Progress Notes (Signed)
Office Visit Note   Patient: Kelsey Archer           Date of Birth: 01/27/1971           MRN: [id number] Visit Date: 03/18/2016              Requested by: Robyne Peers, MD [id number] [place], [place] [address] PCP: Robyne Peers., MD   Assessment & Plan: Visit Diagnoses:  1. Acute pain of right shoulder     Plan: Impression is right pectoralis minor tendinitis versus strain. Recommend continue rest for 4 weeks anti-inflammatories as needed follow-up as needed.  Follow-Up Instructions: Return if symptoms worsen or fail to improve.   Orders:  Orders Placed This Encounter  Procedures  . XR Shoulder Right   No orders of the defined types were placed in this encounter.     Procedures: No procedures performed   Clinical Data: No additional findings.   Subjective: Chief Complaint  Patient presents with  . Right Shoulder - Pain    Kelsey Archer is a 45 year old female who is well-known to me. She comes in with 3 week history of right shoulder pain. She started having pain after working out and doing planks. The pain has gotten better. She does have some weakness with shoulder retraction and pushups. The pain is right around the coracoid and infraclavicular region. She denies any chest pain shortness of breath or pleuritic chest pain. She denies any previous history of DVT. Denies any clicking or trauma.    Review of Systems  Constitutional: Negative.   HENT: Negative.   Eyes: Negative.   Respiratory: Negative.   Cardiovascular: Negative.   Endocrine: Negative.   Musculoskeletal: Negative.   Neurological: Negative.   Hematological: Negative.   Psychiatric/Behavioral: Negative.   All other systems reviewed and are negative.    Objective: Vital Signs: LMP 01/08/2013   Physical Exam  Constitutional: She is oriented to person, place, and time. She appears well-developed and well-nourished.  HENT:  Head: Atraumatic.  Eyes: EOM are normal.    Neck: Neck supple.  Cardiovascular: Intact distal pulses.   Pulmonary/Chest: Effort normal.  Abdominal: Soft.  Neurological: She is alert and oriented to person, place, and time.  Skin: Skin is warm. Capillary refill takes less than 2 seconds.  Psychiatric: She has a normal mood and affect. Her behavior is normal. Judgment and thought content normal.  Nursing note and vitals reviewed.   Ortho Exam Exam of the right shoulder region shows normal range of motion and rotator cuff strength in testing. She is slightly tender to palpation over the coracoid. She is neurovascularly intact distally. No asymmetry in the axillary fold. Specialty Comments:  No specialty comments available.  Imaging: Xr Shoulder Right  Result Date: 03/18/2016 No acute abnormalities.    PMFS History: Patient Active Problem List   Diagnosis Date Noted  . Palpitation 08/06/2012   Past Medical History:  Diagnosis Date  . Palpitations     No family history on file.  Past Surgical History:  Procedure Laterality Date  . BREAST SURGERY     implants  . CESAREAN SECTION    . TONSILLECTOMY     Social History   Occupational History  . Not on file.   Social History Main Topics  . Smoking status: Never Smoker  . Smokeless tobacco: Not on file  . Alcohol use 8.4 oz/week    14 Glasses of wine per week  . Drug use: No  .  Sexual activity: Not on file

## 2016-06-14 ENCOUNTER — Other Ambulatory Visit (INDEPENDENT_AMBULATORY_CARE_PROVIDER_SITE_OTHER): Payer: Self-pay | Admitting: Orthopaedic Surgery

## 2016-06-14 MED ORDER — TIZANIDINE HCL 2 MG PO TABS: 2.0000 mg | ORAL_TABLET | Freq: Three times a day (TID) | ORAL | 0 refills | Status: DC | PRN

## 2016-06-30 ENCOUNTER — Telehealth (INDEPENDENT_AMBULATORY_CARE_PROVIDER_SITE_OTHER): Payer: Self-pay | Admitting: *Deleted

## 2016-06-30 NOTE — Telephone Encounter (Signed)
Todd calling from [facility] to confirm if a PA has been received.

## 2016-06-30 NOTE — Telephone Encounter (Signed)
Called Todd to let him know we have not received form. he will re fax form

## 2016-07-07 NOTE — Telephone Encounter (Signed)
PA DONE THROUGH COVER MY MEDS

## 2016-07-13 ENCOUNTER — Telehealth (INDEPENDENT_AMBULATORY_CARE_PROVIDER_SITE_OTHER): Payer: Self-pay | Admitting: Orthopaedic Surgery

## 2016-07-13 NOTE — Telephone Encounter (Signed)
Will wait for fax.

## 2016-07-13 NOTE — Telephone Encounter (Signed)
Sharita from [facility] is going to be faxing over some information on a pre authorization for the patient. She said some information was left off of the first pre authorization. CB # [id number] ex [id number]

## 2017-05-22 ENCOUNTER — Other Ambulatory Visit: Payer: Self-pay | Admitting: Obstetrics and Gynecology

## 2017-05-22 DIAGNOSIS — Z1231 Encounter for screening mammogram for malignant neoplasm of breast: Secondary | ICD-10-CM

## 2017-06-07 ENCOUNTER — Ambulatory Visit
Admission: RE | Admit: 2017-06-07 | Discharge: 2017-06-07 | Disposition: A | Discharge status: Home / Self Care | Payer: BLUE CROSS/BLUE SHIELD | Source: Ambulatory Visit | Attending: Obstetrics and Gynecology | Admitting: Obstetrics and Gynecology

## 2017-06-07 DIAGNOSIS — Z1231 Encounter for screening mammogram for malignant neoplasm of breast: Secondary | ICD-10-CM

## 2017-06-08 ENCOUNTER — Ambulatory Visit
Admission: RE | Admit: 2017-06-08 | Discharge: 2017-06-08 | Disposition: A | Discharge status: Home / Self Care | Payer: BLUE CROSS/BLUE SHIELD | Source: Ambulatory Visit | Attending: Obstetrics and Gynecology | Admitting: Obstetrics and Gynecology

## 2017-06-08 HISTORY — DX: Malignant (primary) neoplasm, unspecified: C80.1

## 2017-06-14 ENCOUNTER — Other Ambulatory Visit: Payer: Self-pay | Admitting: Obstetrics and Gynecology

## 2017-06-14 DIAGNOSIS — Z803 Family history of malignant neoplasm of breast: Secondary | ICD-10-CM

## 2017-09-12 ENCOUNTER — Ambulatory Visit (INDEPENDENT_AMBULATORY_CARE_PROVIDER_SITE_OTHER): Payer: BLUE CROSS/BLUE SHIELD | Admitting: Orthopaedic Surgery

## 2017-09-12 ENCOUNTER — Ambulatory Visit (INDEPENDENT_AMBULATORY_CARE_PROVIDER_SITE_OTHER): Payer: BLUE CROSS/BLUE SHIELD

## 2017-09-12 ENCOUNTER — Encounter (INDEPENDENT_AMBULATORY_CARE_PROVIDER_SITE_OTHER): Payer: Self-pay | Admitting: Orthopaedic Surgery

## 2017-09-12 DIAGNOSIS — M25511 Pain in right shoulder: Secondary | ICD-10-CM

## 2017-09-12 DIAGNOSIS — G8929 Other chronic pain: Secondary | ICD-10-CM | POA: Diagnosis not present

## 2017-09-12 NOTE — Progress Notes (Signed)
Office Visit Note   Patient: Kelsey Archer           Date of Birth: 1970-07-08           MRN: [id number] Visit Date: 09/12/2017              Requested by: Robyne Peers, MD [address] [place], [place] [address] PCP: Robyne Peers, MD   Assessment & Plan: Visit Diagnoses:  1. Chronic right shoulder pain     Plan: Impression is right shoulder pain suspect rotator cuff tendinitis versus biceps and labral pathology.  She is overall feeling better.  Her pain location is more along the lines of the rotator cuff although her pain is fully intact and causes no pain with testing.  I did offer subacromial injection to see if this will settle it down but since she is already feeling better she would like to just try NSAIDs and give it some more time.  If she is not better in a couple weeks she will let us know.  Follow-Up Instructions: Return if symptoms worsen or fail to improve.   Orders:  Orders Placed This Encounter  Procedures  . XR Shoulder Right   No orders of the defined types were placed in this encounter.     Procedures: No procedures performed   Clinical Data: No additional findings.   Subjective: Chief Complaint  Patient presents with  . Right Shoulder - Pain, Follow-up    Kelsey Archer is a 47 year old female comes in with right shoulder pain localized to the deltoid region for a couple weeks.  She denies any injuries.  Denies any numbness and tingling or neck pain.  She has some difficulty sleeping on that right side.  She states that she has felt somewhat better over the last week or so.  The pain is worse with lifting objects away from the body.   Review of Systems  Constitutional: Negative.   HENT: Negative.   Eyes: Negative.   Respiratory: Negative.   Cardiovascular: Negative.   Endocrine: Negative.   Musculoskeletal: Negative.   Neurological: Negative.   Hematological: Negative.   Psychiatric/Behavioral: Negative.   All other  systems reviewed and are negative.    Objective: Vital Signs: LMP 01/08/2013   Physical Exam  Constitutional: She is oriented to person, place, and time. She appears well-developed and well-nourished.  Pulmonary/Chest: Effort normal.  Neurological: She is alert and oriented to person, place, and time.  Skin: Skin is warm. Capillary refill takes less than 2 seconds.  Psychiatric: She has a normal mood and affect. Her behavior is normal. Judgment and thought content normal.  Nursing note and vitals reviewed.   Ortho Exam Right shoulder exam shows pain in the deltoid region with cross adduction.  AC joint is not tender with cross adduction.  No impingement signs.  Mildly positive O'Brien and Speed test.  Mildly positive crank test.  Empty can testing is negative.  Negative belly press and negative bearhug. Specialty Comments:  No specialty comments available.  Imaging: Xr Shoulder Right  Result Date: 09/12/2017 No acute or structural abnormalities    PMFS History: Patient Active Problem List   Diagnosis Date Noted  . Palpitation 08/06/2012   Past Medical History:  Diagnosis Date  . Cancer ([facility])    basel cell- nose- 2018   . Palpitations     Family History  Problem Relation Age of Onset  . Breast cancer Mother   . Breast cancer Maternal  Grandmother   . BRCA 1/2 Neg Hx     Past Surgical History:  Procedure Laterality Date  . AUGMENTATION MAMMAPLASTY Bilateral    silicone/ retropectoral 2010  . BREAST SURGERY     implants  . CESAREAN SECTION    . TONSILLECTOMY     Social History   Occupational History  . Not on file  Tobacco Use  . Smoking status: Never Smoker  . Smokeless tobacco: Never Used  Substance and Sexual Activity  . Alcohol use: Yes    Alcohol/week: 8.4 oz    Types: 14 Glasses of wine per week  . Drug use: No  . Sexual activity: Not on file

## 2018-06-15 ENCOUNTER — Other Ambulatory Visit: Payer: Self-pay | Admitting: Obstetrics and Gynecology

## 2018-06-15 DIAGNOSIS — Z1231 Encounter for screening mammogram for malignant neoplasm of breast: Secondary | ICD-10-CM

## 2018-07-12 ENCOUNTER — Ambulatory Visit: Payer: BLUE CROSS/BLUE SHIELD

## 2018-07-17 ENCOUNTER — Other Ambulatory Visit: Payer: BLUE CROSS/BLUE SHIELD

## 2018-12-10 ENCOUNTER — Ambulatory Visit: Payer: Self-pay

## 2019-06-16 ENCOUNTER — Ambulatory Visit: Payer: BC Managed Care – PPO | Attending: Internal Medicine

## 2019-06-16 DIAGNOSIS — Z23 Encounter for immunization: Secondary | ICD-10-CM

## 2019-06-16 NOTE — Progress Notes (Signed)
   Covid-19 Vaccination Clinic  Name:  Kelsey Archer    MRN: [id number] DOB: 1971/01/03  06/16/2019  Kelsey Archer was observed post Covid-19 immunization for 15 minutes without incident. She was provided with Vaccine Information Sheet and instruction to access the V-Safe system.   Kelsey Archer was instructed to call 911 with any severe reactions post vaccine: Marland Kitchen Difficulty breathing  . Swelling of face and throat  . A fast heartbeat  . A bad rash all over body  . Dizziness and weakness   Immunizations Administered    Name Date Dose VIS Date Route   Pfizer COVID-19 Vaccine 06/16/2019  5:52 PM 0.3 mL 03/22/2019 Intramuscular   Manufacturer: [employer]   Lot: [id number]   [place]: [id number]

## 2019-07-16 ENCOUNTER — Ambulatory Visit: Payer: Self-pay

## 2019-07-16 ENCOUNTER — Ambulatory Visit: Payer: BC Managed Care – PPO | Admitting: Orthopaedic Surgery

## 2019-07-16 ENCOUNTER — Other Ambulatory Visit: Payer: Self-pay

## 2019-07-16 ENCOUNTER — Encounter: Payer: Self-pay | Admitting: Orthopaedic Surgery

## 2019-07-16 VITALS — Ht 61.0 in | Wt 110.0 lb

## 2019-07-16 DIAGNOSIS — M25511 Pain in right shoulder: Secondary | ICD-10-CM | POA: Diagnosis not present

## 2019-07-16 DIAGNOSIS — G8929 Other chronic pain: Secondary | ICD-10-CM

## 2019-07-16 NOTE — Progress Notes (Signed)
Office Visit Note   Patient: Kelsey Archer           Date of Birth: [birth year]           MRN: [id number] Visit Date: 07/16/2019              Requested by: Robyne Peers, MD [address] [place],  [place] [address] PCP: Robyne Peers, MD   Assessment & Plan: Visit Diagnoses:  1. Chronic right shoulder pain     Plan: Impression is chronic right shoulder pain.  The pain is not constant.  I am leaning towards an overuse problem such as a biceps tendinitis or bursitis.  Based on discussion of treatment options that included cortisone injection, oral NSAIDs, steroid Dosepak, [name] would like to try some relative rest and activity modification.  She does suffer from acid reflux so she would like to avoid NSAIDs if possible.  She will reach back out to me in about a month or so if she does not feel any improvement.  Questions encouraged and answered.  Follow-Up Instructions: Return if symptoms worsen or fail to improve.   Orders:  Orders Placed This Encounter  Procedures  . XR Shoulder Right   No orders of the defined types were placed in this encounter.     Procedures: No procedures performed   Clinical Data: No additional findings.   Subjective: Chief Complaint  Patient presents with  . Right Shoulder - Pain    Kelsey Archer is a very pleasant 49 year old female who is well-known to me who comes in for evaluation of right shoulder pain over the last several weeks to months.  She denies any specific injuries or trauma.  She is very active with exercise and yoga.  She states that she has a pinching pain when she raises her arm directly above her head and it is reproducible.  She denies any radicular symptoms.  The pain is localized to the anterior lateral aspect of the shoulder just off the side of the acromion.  This feels deep in the shoulder.  She does not feel any weakness.  She has not really taken any medications.  She has noticed that relative rest has  helped the pain.   Review of Systems   Objective: Vital Signs: Ht '5\' 1"'  (1.549 m)   Wt 110 lb (49.9 kg)   LMP 01/08/2013   BMI 20.78 kg/m   Physical Exam  Ortho Exam Right shoulder shows full range of motion.  She does have reproducible pain with terminal forward flexion.  Reaching out to the side also reproduces the pain.  Manual muscle testing is normal.  Negative empty can.  Negative Hawkins impingement.  Coracoid is nontender.  Slight discomfort with cross body adduction.  AC joint is nontender.  Negative O'Brien.  Negative crank.  Biceps tendon is mildly tender to palpation.  Acromion is nontender. Specialty Comments:  No specialty comments available.  Imaging: XR Shoulder Right  Result Date: 07/16/2019 No acute or structural abnormalities    PMFS History: Patient Active Problem List   Diagnosis Date Noted  . Palpitation 08/06/2012   Past Medical History:  Diagnosis Date  . Cancer ([facility])    basel cell- nose- 2018   . Palpitations     Family History  Problem Relation Age of Onset  . Breast cancer Mother   . Breast cancer Maternal Grandmother   . BRCA 1/2 Neg Hx     Past Surgical History:  Procedure  Laterality Date  . AUGMENTATION MAMMAPLASTY Bilateral    silicone/ retropectoral 2010  . BREAST SURGERY     implants  . CESAREAN SECTION    . TONSILLECTOMY     Social History   Occupational History  . Not on file  Tobacco Use  . Smoking status: Never Smoker  . Smokeless tobacco: Never Used  Substance and Sexual Activity  . Alcohol use: Yes    Alcohol/week: 14.0 standard drinks    Types: 14 Glasses of wine per week  . Drug use: No  . Sexual activity: Not on file

## 2019-07-17 ENCOUNTER — Ambulatory Visit: Payer: BC Managed Care – PPO | Attending: Internal Medicine

## 2019-07-17 DIAGNOSIS — Z23 Encounter for immunization: Secondary | ICD-10-CM

## 2019-07-17 NOTE — Progress Notes (Signed)
   Covid-19 Vaccination Clinic  Name:  [name]    MRN: [id number] DOB: May 26, 1970  07/17/2019  [name] was observed post Covid-19 immunization for 15 minutes without incident. She was provided with Vaccine Information Sheet and instruction to access the V-Safe system.   [name] was instructed to call 911 with any severe reactions post vaccine: Marland Kitchen Difficulty breathing  . Swelling of face and throat  . A fast heartbeat  . A bad rash all over body  . Dizziness and weakness   Immunizations Administered    Name Date Dose VIS Date Route   Pfizer COVID-19 Vaccine 07/17/2019  8:50 AM 0.3 mL 03/22/2019 Intramuscular   Manufacturer: [employer], [employer]   Lot: [id number]   [place]: [id number]

## 2019-11-20 ENCOUNTER — Encounter: Payer: Self-pay | Admitting: Genetic Counselor

## 2019-12-03 ENCOUNTER — Ambulatory Visit: Payer: BC Managed Care – PPO | Admitting: Orthopaedic Surgery

## 2019-12-03 ENCOUNTER — Encounter: Payer: Self-pay | Admitting: Orthopaedic Surgery

## 2019-12-03 ENCOUNTER — Ambulatory Visit: Payer: Self-pay

## 2019-12-03 DIAGNOSIS — M542 Cervicalgia: Secondary | ICD-10-CM | POA: Diagnosis not present
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
## 2019-12-10 ENCOUNTER — Telehealth: Payer: Self-pay

## 2019-12-10 NOTE — Telephone Encounter (Signed)
NOTES ON FILE FROM  Dian Queen MD [phone], SENT NOTES TO SCHEDULING

## 2020-03-20 ENCOUNTER — Encounter: Payer: Self-pay | Admitting: Internal Medicine

## 2020-03-20 ENCOUNTER — Other Ambulatory Visit: Payer: Self-pay

## 2020-03-20 ENCOUNTER — Ambulatory Visit: Payer: BC Managed Care – PPO | Admitting: Internal Medicine

## 2020-03-20 VITALS — BP 116/80 | HR 74 | Ht 61.0 in | Wt 107.4 lb

## 2020-03-20 DIAGNOSIS — E785 Hyperlipidemia, unspecified: Secondary | ICD-10-CM | POA: Diagnosis not present

## 2020-03-20 NOTE — Progress Notes (Signed)
LIPID CLINIC CONSULT NOTE  Chief Complaint:  Dyslipidemia  Primary Care Physician: Robyne Peers, MD  Primary Cardiologist:  No primary care provider on file.  HPI:  Kelsey Archer is a 49 y.o. female who is being seen today for the evaluation of dyslipidemia at the request of Dian Queen, MD.  This is a pleasant 49 year old female kindly referred by Dr. Helane Rima for evaluation and management of dyslipidemia.  She reports a family history of heart disease in her father who had CABG in his 39's.  She has had some palpitations in the past.  More recently she had a lipid profile performed in August 2021 showing total cholesterol 230, HDL 79, triglycerides 64 and calculated LDL of 138.  She reports a fit and active lifestyle.  She is of normal weight, exercises regularly and eats a healthy diet.  She is actually down about 3 pounds since April according to the chart records.  There was question as to whether or not she should seek treatment for her elevated cholesterol which total was high however I suspect that she has cardioprotective lipid profile given her very high HDL cholesterol.  Sometimes however HDL is not functional and even though her lipid ratio is favorable, she may be at increased risk.  PMHx:  Past Medical History:  Diagnosis Date  . Cancer ([place])    basel cell- nose- 2018   . Palpitations     Past Surgical History:  Procedure Laterality Date  . AUGMENTATION MAMMAPLASTY Bilateral    silicone/ retropectoral 2010  . BREAST SURGERY     implants  . CESAREAN SECTION    . TONSILLECTOMY      FAMHx:  Family History  Problem Relation Age of Onset  . Breast cancer Mother   . Breast cancer Maternal Grandmother   . BRCA 1/2 Neg Hx     SOCHx:   reports that she has never smoked. She has never used smokeless tobacco. She reports current alcohol use of about 14.0 standard drinks of alcohol per week. She reports that she does not use drugs.  ALLERGIES:  No  Active Allergies  ROS: Pertinent items noted in HPI and remainder of comprehensive ROS otherwise negative.  HOME MEDS: Current Outpatient Medications on File Prior to Visit  Medication Sig Dispense Refill  . albuterol (VENTOLIN HFA) 108 (90 Base) MCG/ACT inhaler albuterol sulfate HFA 90 mcg/actuation aerosol inhaler  INHALE 2 PUFFS EVERY 4 6 HOURS AS NEEDED COUGH/WHEEZE 90 DAYS    . ALPRAZolam (XANAX) 0.25 MG tablet alprazolam 0.25 mg tablet  TAKE 1 TABLET BY MOUTH 3 (THREE) TIMES DAILY AS NEEDED FOR ANXIETY. MAY TAKE 2 TABLETS IF NEEDED    . buPROPion (WELLBUTRIN XL) 300 MG 24 hr tablet Take by mouth.    . EPINEPHrine 0.3 mg/0.3 mL IJ SOAJ injection epinephrine 0.3 mg/0.3 mL injection, auto-injector  INJECT INTO OUTER THIGH AS NEEDED FOR ALLERGIC REACTION    . Estradiol-Estriol-Progesterone (BIEST/PROGESTERONE) CREA compounded medication  BIEST 5/50.75ML/ TESTOSTERONE 4.1DE/YC  3 CLICKS DAILY    . Misc Natural Products (PROGESTERONE EX) Apply 1 application topically daily.    . Omega-3 Fatty Acids (FISH OIL) 1000 MG CAPS Fish Oil    . omeprazole (PRILOSEC) 40 MG capsule omeprazole 40 mg capsule,delayed release  TAKE 1 CAPSULE (40 MG TOTAL) BY MOUTH 2 (TWO) TIMES DAILY BEFORE MEALS    . progesterone (PROMETRIUM) 100 MG capsule Take 100 mg by mouth daily.    Marland Kitchen PROGESTERONE PO Take by mouth.    Marland Kitchen  VITAMIN D PO Vitamin D     No current facility-administered medications on file prior to visit.    LABS/IMAGING: No results found for this or any previous visit (from the past 48 hour(s)). No results found.  LIPID PANEL: No results found for: CHOL, TRIG, HDL, CHOLHDL, VLDL, LDLCALC, LDLDIRECT  WEIGHTS: Wt Readings from Last 3 Encounters:  03/20/20 107 lb 6.4 oz (48.7 kg)  07/16/19 110 lb (49.9 kg)  01/23/13 105 lb (47.6 kg)    VITALS: BP 116/80   Pulse 74   Ht '5\' 1"'  (1.549 m)   Wt 107 lb 6.4 oz (48.7 kg)   LMP 01/08/2013   BMI 20.29 kg/m   EXAM: General appearance: alert  and no distress Neck: no carotid bruit, no JVD and thyroid not enlarged, symmetric, no tenderness/mass/nodules Lungs: clear to auscultation bilaterally Heart: regular rate and rhythm, S1, S2 normal, no murmur, click, rub or gallop Abdomen: soft, non-tender; bowel sounds normal; no masses,  no organomegaly Extremities: extremities normal, atraumatic, no cyanosis or edema Pulses: 2+ and symmetric Skin: Skin color, texture, turgor normal. No rashes or lesions Neurologic: Grossly normal Psych: Pleasant   EKG: Deferred  ASSESSMENT: 1. Dyslipidemia with high HDL 2. Family history of heart disease in her father, not early onset 85. History of palpitations  PLAN: 1.   Kelsey Archer has a dyslipidemia but a high HDL cholesterol.  She is very physically active, eats a healthy diet and has had no other significant comorbidities concerning for increased risk for heart disease.  There was some heart disease in her father and I think we could restratify her further with a calcium score.  Would also get lipid NMR testing to rule out any other possible risk being on her standard lipid profile.  I think overall though her lipids are reassuring with a high total cholesterol related to predominantly high HDL cholesterol which would suggest a low risk of events.  If her calcium score is 0 or low risk and her lipid profile is favorable, then I would likely not recommend any medications at this time.  Thanks as always for the kind referral.  Pixie Casino, MD, FACC, [place] Director of the Advanced Lipid Disorders &  Cardiovascular Risk Reduction Clinic Diplomate of the American Board of Clinical Lipidology Attending Cardiologist  Direct Dial: [phone]  Fax: [phone]  Website:  [email or web address] 03/20/2020, 1:45 PM

## 2020-03-20 NOTE — Patient Instructions (Signed)
Medication Instructions:  Your physician recommends that you continue on your current medications as directed. Please refer to the Current Medication list given to you today.  *If you need a refill on your cardiac medications before your next appointment, please call your pharmacy*   Lab Work: Your physician recommends that you have labs done today: NMR lipid profile  If you have labs (blood work) drawn today and your tests are completely normal, you will receive your results only by: Marland Kitchen MyChart Message (if you have MyChart) OR . A paper copy in the mail If you have any lab test that is abnormal or we need to change your treatment, we will call you to review the results.   Testing/Procedures: Dr. Debara Pickett has ordered a CT coronary calcium score. This test is done at [address]. This is $150 out of pocket.   Coronary CalciumScan A coronary calcium scan is an imaging test used to look for deposits of calcium and other fatty materials (plaques) in the inner lining of the blood vessels of the heart (coronary arteries). These deposits of calcium and plaques can partly clog and narrow the coronary arteries without producing any symptoms or warning signs. This puts a person at risk for a heart attack. This test can detect these deposits before symptoms develop. Tell a health care provider about:  Any allergies you have.  All medicines you are taking, including vitamins, herbs, eye drops, creams, and over-the-counter medicines.  Any problems you or family members have had with anesthetic medicines.  Any blood disorders you have.  Any surgeries you have had.  Any medical conditions you have.  Whether you are pregnant or may be pregnant. What are the risks? Generally, this is a safe procedure. However, problems may occur, including:  Harm to a pregnant woman and her unborn baby. This test involves the use of radiation. Radiation exposure can be dangerous to a pregnant  woman and her unborn baby. If you are pregnant, you generally should not have this procedure done.  Slight increase in the risk of cancer. This is because of the radiation involved in the test. What happens before the procedure? No preparation is needed for this procedure. What happens during the procedure?  You will undress and remove any jewelry around your neck or chest.  You will put on a hospital gown.  Sticky electrodes will be placed on your chest. The electrodes will be connected to an electrocardiogram (ECG) machine to record a tracing of the electrical activity of your heart.  A CT scanner will take pictures of your heart. During this time, you will be asked to lie still and hold your breath for 2-3 seconds while a picture of your heart is being taken. The procedure may vary among health care providers and hospitals. What happens after the procedure?  You can get dressed.  You can return to your normal activities.  It is up to you to get the results of your test. Ask your health care provider, or the department that is doing the test, when your results will be ready. Summary  A coronary calcium scan is an imaging test used to look for deposits of calcium and other fatty materials (plaques) in the inner lining of the blood vessels of the heart (coronary arteries).  Generally, this is a safe procedure. Tell your health care provider if you are pregnant or may be pregnant.  No preparation is needed for this procedure.  A CT scanner will  take pictures of your heart.  You can return to your normal activities after the scan is done. This information is not intended to replace advice given to you by your health care provider. Make sure you discuss any questions you have with your health care provider. Document Released: 09/24/2007 Document Revised: 02/15/2016 Document Reviewed: 02/15/2016 Elsevier Interactive Patient Education  2017 [place]: At [facility], you and your health needs are our priority.  As part of our continuing mission to provide you with exceptional heart care, we have created designated Provider Care Teams.  These Care Teams include your primary Cardiologist (physician) and Advanced Practice Providers (APPs -  Physician Assistants and Nurse Practitioners) who all work together to provide you with the care you need, when you need it.  We recommend signing up for the patient portal called "MyChart".  Sign up information is provided on this After Visit Summary.  MyChart is used to connect with patients for Virtual Visits (Telemedicine).  Patients are able to view lab/test results, encounter notes, upcoming appointments, etc.  Non-urgent messages can be sent to your provider as well.   To learn more about what you can do with MyChart, go to [email or web address].    Your next appointment:   1 month(s)  The format for your next appointment:   In Person  Provider:   K. Mali Hilty, MD   Other Instructions In [facility]

## 2020-03-23 LAB — NMR, LIPOPROFILE
Cholesterol, Total: 218 mg/dL — ABNORMAL HIGH (ref 100–199)
HDL Particle Number: 33.7 umol/L (ref >=30.5)
HDL-C: 74 mg/dL (ref >39)
LDL Particle Number: 1447 nmol/L — ABNORMAL HIGH (ref <1000)
LDL Size: 21.5 nm (ref >20.5)
LDL-C (NIH Calc): 131 mg/dL — ABNORMAL HIGH (ref 0–99)
LP-IR Score: <25 (ref <=45)
Small LDL Particle Number: 432 nmol/L (ref <=527)
Triglycerides: 76 mg/dL (ref 0–149)

## 2020-04-17 ENCOUNTER — Inpatient Hospital Stay: Admission: RE | Admit: 2020-04-17 | Payer: BC Managed Care – PPO | Source: Ambulatory Visit

## 2020-04-22 ENCOUNTER — Inpatient Hospital Stay: Admission: RE | Admit: 2020-04-22 | Payer: Self-pay | Source: Ambulatory Visit

## 2020-05-04 NOTE — Telephone Encounter (Signed)
FORWARDED TO:  [facility] [address], [place] [address] Main number: [phone] Scheduling: [phone] Fax: [phone] Billing questions: [phone] E-mail : [email or web address]  Via EPIC fax function, will await call back.

## 2020-05-05 ENCOUNTER — Ambulatory Visit: Payer: BC Managed Care – PPO | Admitting: Internal Medicine

## 2020-05-06 NOTE — Telephone Encounter (Signed)
Calcium score report received from [facility] in [place], [place] Dr. Debara Pickett review - no calcified plaque in the coronary arteries  Patient notified via MyChart message

## 2020-05-08 ENCOUNTER — Other Ambulatory Visit: Payer: BC Managed Care – PPO

## 2020-05-21 ENCOUNTER — Telehealth (INDEPENDENT_AMBULATORY_CARE_PROVIDER_SITE_OTHER): Payer: BC Managed Care – PPO | Admitting: Internal Medicine

## 2020-05-21 ENCOUNTER — Encounter: Payer: Self-pay | Admitting: Internal Medicine

## 2020-05-21 VITALS — BP 117/77 | Wt 108.0 lb

## 2020-05-21 DIAGNOSIS — R7989 Other specified abnormal findings of blood chemistry: Secondary | ICD-10-CM

## 2020-05-21 DIAGNOSIS — E785 Hyperlipidemia, unspecified: Secondary | ICD-10-CM

## 2020-05-21 NOTE — Patient Instructions (Signed)
Medication Instructions:  Your physician recommends that you continue on your current medications as directed. Please refer to the Current Medication list given to you today.  *If you need a refill on your cardiac medications before your next appointment, please call your pharmacy*   Follow-Up: At [facility], you and your health needs are our priority.  As part of our continuing mission to provide you with exceptional heart care, we have created designated Provider Care Teams.  These Care Teams include your primary Cardiologist (physician) and Advanced Practice Providers (APPs -  Physician Assistants and Nurse Practitioners) who all work together to provide you with the care you need, when you need it.  We recommend signing up for the patient portal called "MyChart".  Sign up information is provided on this After Visit Summary.  MyChart is used to connect with patients for Virtual Visits (Telemedicine).  Patients are able to view lab/test results, encounter notes, upcoming appointments, etc.  Non-urgent messages can be sent to your provider as well.   To learn more about what you can do with MyChart, go to [email or web address].    Your next appointment:   AS NEEDED with Dr. Hilty for lipid management    

## 2020-05-21 NOTE — Progress Notes (Signed)
Virtual Visit via Video Note   This visit type was conducted due to national recommendations for restrictions regarding the COVID-19 Pandemic (e.g. social distancing) in an effort to limit this patient's exposure and mitigate transmission in our community.  Due to her co-morbid illnesses, this patient is at least at moderate risk for complications without adequate follow up.  This format is felt to be most appropriate for this patient at this time.  All issues noted in this document were discussed and addressed.  A limited physical exam was performed with this format.  Please refer to the patient's chart for her consent to telehealth for [facility].      Date:  05/21/2020   ID:  Kelsey Archer, DOB Aug 12, 1970, MRN [id number] The patient was identified using 2 identifiers.  Evaluation Performed:  Follow-Up Visit  Patient Location:  [place] [address]  Provider location:   [address] [place], [place] [address]  PCP:  Robyne Peers, MD  Cardiologist:  No primary care provider on file. Electrophysiologist:  None   Chief Complaint:  Follow-up calcium score/lipids  History of Present Illness:    Kelsey Archer is a 50 y.o. female who presents via audio/video conferencing for a telehealth visit today.  This is a pleasant 50 year old female kindly referred by Dr. Helane Rima for evaluation and management of dyslipidemia.  She reports a family history of heart disease in her father who had CABG in his 50's.  She has had some palpitations in the past.  More recently she had a lipid profile performed in August 2021 showing total cholesterol 230, HDL 79, triglycerides 64 and calculated LDL of 138.  She reports a fit and active lifestyle.  She is of normal weight, exercises regularly and eats a healthy diet.  She is actually down about 3 pounds since April according to the chart records.  There was question as to whether or not she should seek  treatment for her elevated cholesterol which total was high however I suspect that she has cardioprotective lipid profile given her very high HDL cholesterol.  Sometimes however HDL is not functional and even though her lipid ratio is favorable, she may be at increased risk.  05/21/2020  Kelsey Archer returns today for follow-up.  She did get a calcium score performed in [place].  The results indicated 0 coronary calcium.  She also had advanced lipid testing with an LDL particle test.  This demonstrated LDL-P of 1447, LDL-C of 131, HDL-C of 74 and triglycerides 76.  Of note she had a high HDL particle number of 33.7 mol/L and a low small LDL particle number of 432 nmol/L.  This is a low risk profile indicating a larger number of LDL particles however they are of larger size, which are less atherogenic.  Her HDL particle numbers are high and HDL content is high providing what appears to be potent antiatherogenic benefits.  In addition her calcium score is reassuring and considered low risk with a 5 to 7-year warranty.  The patient does not have symptoms concerning for COVID-19 infection (fever, chills, cough, or new SHORTNESS OF BREATH).    Prior CV studies:   The following studies were reviewed today:  Lipid profile, calcium score  PMHx:  Past Medical History:  Diagnosis Date  . Cancer ([place])    basel cell- nose- 2018   . Palpitations     Past Surgical History:  Procedure Laterality Date  . AUGMENTATION MAMMAPLASTY Bilateral  silicone/ retropectoral 2010  . BREAST SURGERY     implants  . CESAREAN SECTION    . TONSILLECTOMY      FAMHx:  Family History  Problem Relation Age of Onset  . Breast cancer Mother   . Breast cancer Maternal Grandmother   . BRCA 1/2 Neg Hx     SOCHx:   reports that she has never smoked. She has never used smokeless tobacco. She reports current alcohol use of about 14.0 standard drinks of alcohol per week. She reports that she does not use  drugs.  ALLERGIES:  No Active Allergies  MEDS:  Current Meds  Medication Sig  . albuterol (VENTOLIN HFA) 108 (90 Base) MCG/ACT inhaler albuterol sulfate HFA 90 mcg/actuation aerosol inhaler  INHALE 2 PUFFS EVERY 4 6 HOURS AS NEEDED COUGH/WHEEZE 90 DAYS  . buPROPion (WELLBUTRIN XL) 300 MG 24 hr tablet Take by mouth.  . cetirizine (ZYRTEC) 10 MG tablet Take 10 mg by mouth daily.  . diazepam (VALIUM) 2 MG tablet Take 2 mg by mouth 2 (two) times daily as needed.  Marland Kitchen EPINEPHrine 0.3 mg/0.3 mL IJ SOAJ injection epinephrine 0.3 mg/0.3 mL injection, auto-injector  INJECT INTO OUTER THIGH AS NEEDED FOR ALLERGIC REACTION  . Estradiol-Estriol-Progesterone (BIEST/PROGESTERONE) CREA compounded medication  BIEST 5/50.75ML/ TESTOSTERONE 8.2XH/BZ  3 CLICKS DAILY  . Fluticasone Propionate (FLONASE NA) Place 1 spray into the nose daily.  . Misc Natural Products (PROGESTERONE EX) Apply 1 application topically daily.  . Omega-3 Fatty Acids (FISH OIL) 1000 MG CAPS Fish Oil  . omeprazole (PRILOSEC) 40 MG capsule omeprazole 40 mg capsule,delayed release  TAKE 1 CAPSULE (40 MG TOTAL) BY MOUTH 2 (TWO) TIMES DAILY BEFORE MEALS  . progesterone (PROMETRIUM) 100 MG capsule Take 100 mg by mouth daily.  Marland Kitchen PROGESTERONE PO Take by mouth.  Marland Kitchen VITAMIN D PO Vitamin D     ROS: Pertinent items noted in HPI and remainder of comprehensive ROS otherwise negative.  Labs/Other Tests and Data Reviewed:    Recent Labs: No results found for requested labs within last 8760 hours.   Recent Lipid Panel No results found for: CHOL, TRIG, HDL, CHOLHDL, LDLCALC, LDLDIRECT  Wt Readings from Last 3 Encounters:  05/21/20 108 lb (49 kg)  03/20/20 107 lb 6.4 oz (48.7 kg)  07/16/19 110 lb (49.9 kg)     Exam:    Vital Signs:  BP 117/77   Wt 108 lb (49 kg)   LMP 01/08/2013   BMI 20.41 kg/m    General appearance: alert and no distress Lungs: No visual respiratory difficulty Abdomen: Normal weight Extremities: extremities  normal, atraumatic, no cyanosis or edema Skin: Skin color, texture, turgor normal. No rashes or lesions Neurologic: Mental status: Alert, oriented, thought content appropriate Psych: Pleasant  ASSESSMENT & PLAN:    1. Dyslipidemia with high HDL 2. 0 coronary artery calcium (04/2020) 3. Family history of heart disease in her father, not early onset 30. History of palpitations  Ms. Archer has a dyslipidemia with high HDL and high HDL particle numbers and although her LDL is elevated, her LDL particles are of larger size and less atherogenic.  Overall this combination is favorable and associated with low cardiovascular risk.  This is likely due to healthy lifestyle diet and frequent exercise.  Her 0 calcium score is also associated with a low risk of cardiovascular events now out to 5 to 7 years.  I would not recommend any repeat testing such as a calcium score before this time.  As  far as monitoring her cholesterol, standard lipid profile if it is similar to her current profile is reasonable although NMR testing is also reasonable to compare particle numbers directly.  This can be followed by her gynecologist or PCP.  I am certainly happy to see her back as needed or answer any questions regarding the testing.  Thanks as always for the kind referral.  COVID-19 Education: The signs and symptoms of COVID-19 were discussed with the patient and how to seek care for testing (follow up with PCP or arrange E-visit).  The importance of social distancing was discussed today.  Patient Risk:   After full review of this patients clinical status, I feel that they are at least moderate risk at this time.  Time:   Today, I have spent 15 minutes with the patient with telehealth technology discussing dyslipidemia, lipoprotein testing, coronary calcium score.     Medication Adjustments/Labs and Tests Ordered: Current medicines are reviewed at length with the patient today.  Concerns regarding medicines are  outlined above.   Tests Ordered: No orders of the defined types were placed in this encounter.   Medication Changes: No orders of the defined types were placed in this encounter.   Disposition:  prn  Pixie Casino, MD, FACC, [place] Director of the Advanced Lipid Disorders &  Cardiovascular Risk Reduction Clinic Diplomate of the American Board of Clinical Lipidology Attending Cardiologist  Direct Dial: [phone]  Fax: [phone]  Website:  [email or web address]  Pixie Casino, MD  05/21/2020 8:12 AM

## 2020-07-16 ENCOUNTER — Other Ambulatory Visit: Payer: Self-pay | Admitting: Obstetrics and Gynecology

## 2020-07-16 DIAGNOSIS — Z9189 Other specified personal risk factors, not elsewhere classified: Secondary | ICD-10-CM

## 2020-09-29 ENCOUNTER — Telehealth: Payer: Self-pay

## 2020-09-29 NOTE — Telephone Encounter (Signed)
Patient called she stated she is returning your phone call, call [phone]

## 2020-09-30 NOTE — Telephone Encounter (Signed)
I made her mom an appt.

## 2021-02-27 ENCOUNTER — Telehealth: Payer: Self-pay | Admitting: Orthopaedic Surgery

## 2021-02-27 MED ORDER — NAPROXEN 500 MG PO TABS: 500.0000 mg | ORAL_TABLET | Freq: Two times a day (BID) | ORAL | 3 refills | Status: AC

## 2021-02-27 MED ORDER — METAXALONE 800 MG PO TABS: 800.0000 mg | ORAL_TABLET | Freq: Three times a day (TID) | ORAL | 0 refills | Status: AC

## 2021-02-27 MED ORDER — PREDNISONE 10 MG (21) PO TBPK: ORAL_TABLET | ORAL | 3 refills | Status: AC

## 2021-02-27 NOTE — Telephone Encounter (Signed)
I spoke with Kelsey Archer today about a back injury she had yesterday.  Denies any radicular or bowel or bladder symptoms.  Based on conversation, impression is back strain.  I will send in meds for her. Activity discussed.

## 2022-09-07 ENCOUNTER — Other Ambulatory Visit (INDEPENDENT_AMBULATORY_CARE_PROVIDER_SITE_OTHER): Payer: Commercial Managed Care - PPO

## 2022-09-07 ENCOUNTER — Ambulatory Visit: Payer: Commercial Managed Care - PPO | Admitting: Orthopaedic Surgery

## 2022-09-07 DIAGNOSIS — M25562 Pain in left knee: Secondary | ICD-10-CM

## 2022-09-07 NOTE — Progress Notes (Signed)
Office Visit Note   Patient: Kelsey Archer           Date of Birth: 03-Apr-1971           MRN: [id number] Visit Date: 09/07/2022              Requested by: Angelica Chessman, MD [address] [place],  [place] [address] PCP: Angelica Chessman, MD   Assessment & Plan: Visit Diagnoses:  1. Acute pain of left knee     Plan: Impression is left patella chondromalacia.  I feel her patellar retinaculi are also a little lax which I am sure is making her symptoms worse.  I would like to send her to physical therapy to learn home exercise program for strengthening and patellar stabilization as well as education on kinesiotaping.  Follow-Up Instructions: No follow-ups on file.   Orders:  Orders Placed This Encounter  Procedures   XR KNEE 3 VIEW LEFT   Ambulatory referral to Physical Therapy   No orders of the defined types were placed in this encounter.     Procedures: No procedures performed   Clinical Data: No additional findings.   Subjective: Chief Complaint  Patient presents with   Left Knee - Pain    HPI Velencia is a very pleasant 52 year old female whom I have known for years who comes in for evaluation of left knee problems.  She has noticed a popping and dislocating sensation from her patella.  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  She feels that the kneecap is loose.  She has tried knee braces without any significant improvement.  She will occasionally take Advil for the pain.  Denies any swelling or mechanical symptoms.  The symptoms are often improved with exercise. Review of Systems  Constitutional: Negative.   HENT: Negative.    Eyes: Negative.   Respiratory: Negative.    Cardiovascular: Negative.   Endocrine: Negative.   Musculoskeletal: Negative.   Neurological: Negative.   Hematological: Negative.   Psychiatric/Behavioral: Negative.    All other systems reviewed and are  negative.    Objective: Vital Signs: LMP 01/08/2013   Physical Exam Vitals and nursing note reviewed.  Constitutional:      Appearance: She is well-developed.  HENT:     Head: Normocephalic and atraumatic.  Pulmonary:     Effort: Pulmonary effort is normal.  Abdominal:     Palpations: Abdomen is soft.  Musculoskeletal:     Cervical back: Neck supple.  Skin:    General: Skin is warm.     Capillary Refill: Capillary refill takes less than 2 seconds.  Neurological:     Mental Status: She is alert and oriented to person, place, and time.  Psychiatric:        Behavior: Behavior normal.        Thought Content: Thought content normal.        Judgment: Judgment normal.     Ortho Exam Examination of the left knee shows no joint effusion.  No joint line tenderness.  Mildly positive patellar apprehension.  Her medial and lateral retinaculum feel a little bit more lax than her contralateral side.  Collaterals and cruciates are stable.  I do feel mild amount of crepitus with range of motion. Specialty Comments:  No specialty comments available.  Imaging: XR KNEE 3 VIEW LEFT  Result Date: 09/07/2022 No acute or structural abnormalities  PMFS History: Patient Active Problem List   Diagnosis Date Noted   Palpitation 08/06/2012   Past Medical History:  Diagnosis Date   Cancer (HCC)    basel cell- nose- 2018    Palpitations     Family History  Problem Relation Age of Onset   Breast cancer Mother    Breast cancer Maternal Grandmother    BRCA 1/2 Neg Hx     Past Surgical History:  Procedure Laterality Date   AUGMENTATION MAMMAPLASTY Bilateral    silicone/ retropectoral 2010   BREAST SURGERY     implants   CESAREAN SECTION     TONSILLECTOMY     Social History   Occupational History   Not on file  Tobacco Use   Smoking status: Never   Smokeless tobacco: Never  Substance and Sexual Activity   Alcohol use: Yes    Alcohol/week: 14.0 standard drinks of alcohol     Types: 14 Glasses of wine per week   Drug use: No   Sexual activity: Not on file

## 2022-09-22 ENCOUNTER — Other Ambulatory Visit: Payer: Self-pay

## 2022-09-22 ENCOUNTER — Encounter: Payer: Self-pay | Admitting: Physical Therapy

## 2022-09-22 ENCOUNTER — Ambulatory Visit (INDEPENDENT_AMBULATORY_CARE_PROVIDER_SITE_OTHER): Payer: Commercial Managed Care - PPO | Admitting: Physical Therapy

## 2022-09-22 DIAGNOSIS — G8929 Other chronic pain: Secondary | ICD-10-CM | POA: Diagnosis not present

## 2022-09-22 DIAGNOSIS — M25562 Pain in left knee: Secondary | ICD-10-CM | POA: Diagnosis not present

## 2022-09-22 DIAGNOSIS — M6281 Muscle weakness (generalized): Secondary | ICD-10-CM | POA: Diagnosis not present

## 2022-09-22 NOTE — Therapy (Signed)
OUTPATIENT PHYSICAL THERAPY LOWER EXTREMITY EVALUATION   Patient Name: Kelsey Archer MRN: [id number] DOB:February 20, 1971, 52 y.o., female Today's Date: 09/22/2022  END OF SESSION:  PT End of Session - 09/22/22 1201     Visit Number 1    Number of Visits 1    PT Start Time 1100    PT Stop Time 1149    PT Time Calculation (min) 49 min    Activity Tolerance Patient tolerated treatment well    Behavior During Therapy WFL for tasks assessed/performed             Past Medical History:  Diagnosis Date   Cancer (HCC)    basel cell- nose- 2018    Palpitations    Past Surgical History:  Procedure Laterality Date   AUGMENTATION MAMMAPLASTY Bilateral    silicone/ retropectoral 2010   BREAST SURGERY     implants   CESAREAN SECTION     TONSILLECTOMY     Patient Active Problem List   Diagnosis Date Noted   Palpitation 08/06/2012    PCP: Angelica Chessman, MD   REFERRING PROVIDER: Tarry Kos, MD  REFERRING DIAG: [phone] (ICD-10-CM) - Acute pain of left knee  THERAPY DIAG:  Chronic pain of left knee  Muscle weakness (generalized)  Rationale for Evaluation and Treatment: Rehabilitation  ONSET DATE: chronic Lt knee pain but worse over last 2 months.  SUBJECTIVE:   SUBJECTIVE STATEMENT: She relays chronic left knee pain and her knee feels loose and her knee cap has dislocated. She has been doing some strengthening which has helped some so she would like to have one PT visit to learn what all she should do and to learn KT tape technique.   PERTINENT HISTORY: Left knee dislocation PAIN:  Are you having pain? Yes: NPRS scale: 2/10 Pain location: anterior knee Pain description: pops, ache Aggravating factors: being sedentary, when she first starts to jog but then after a few minutes it gets better, but can get worse with too much activity.  Relieving factors: better with exercise  PRECAUTIONS: None  WEIGHT BEARING RESTRICTIONS: No  FALLS:  Has patient fallen  in last 6 months? No   PLOF: Independent  PATIENT GOALS: learn HEP and how to tape her knee.  NEXT MD VISIT:   OBJECTIVE:   DIAGNOSTIC FINDINGS: XR impression "No acute or structural abnormalities"  PATIENT SURVEYS:  No FOTO as one time visit.   COGNITION: Overall cognitive status: Within functional limits for tasks assessed      MUSCLE LENGTH: Hamstrings: mild tightness Thomas test: mild tightness   LOWER EXTREMITY ROM:  Active ROM Right eval Left eval  Hip flexion    Hip extension    Hip abduction    Hip adduction    Hip internal rotation    Hip external rotation    Knee flexion  WNL  Knee extension  WNL  Ankle dorsiflexion    Ankle plantarflexion    Ankle inversion    Ankle eversion     (Blank rows = not tested)  LOWER EXTREMITY MMT:  MMT Right eval Left eval  Hip flexion 4+ 4  Hip extension 4 4  Hip abduction 4+ 4+  Hip adduction  3+  Hip internal rotation    Hip external rotation    Knee flexion 5 5  Knee extension 5 40# avg 5 43# avg  Ankle dorsiflexion    Ankle plantarflexion    Ankle inversion    Ankle eversion     (  Blank rows = not tested)  LOWER EXTREMITY SPECIAL TESTS:  Eval: Left knee patella lateral tracking noted  GAIT:Eval Comments: WNL   TODAY'S TREATMENT:  Eval Therex: HEP creation and review with demonstration and trial set preformed, see below for details Manual: KT tape for left knee patella support and for patella maltracting. 2 I strips to circle around patella for general support then 2 small I strips with medial pull on patella.    PATIENT EDUCATION: Education details: HEP, PT plan of care, KT tape Person educated: Patient Education method: Explanation, Demonstration, Verbal cues, and Handouts Education comprehension: verbalized understanding and needs further education   HOME EXERCISE PROGRAM: Access Code: [email or web address] URL: [email or web address] Date: 09/22/2022 Prepared by: Ivery Quale  Exercises - Supine Bridge with [employer] Between Knees  - 2 x daily - 6 x weekly - 2 sets - 10 reps - 5 sec hold - Straight Leg Raise with External Rotation  - 2 x daily - 6 x weekly - 2-3 sets - 10 reps - Prone Hip Extension  - 2 x daily - 6 x weekly - 2-3 sets - 10 reps - Sidelying Hip Adduction  - 2 x daily - 6 x weekly - 2-3 sets - 10 reps - Wall Squat Hold with Ball  - 2 x daily - 6 x weekly - 2-3 sets - 10 reps - 10 hold - Trail Leg Lunge  - 2 x daily - 6 x weekly - 2 sets - 10 reps - Single Leg Sit to Stand with Arms Extended  - 2 x daily - 6 x weekly - 2 sets - 10 reps  ASSESSMENT:  CLINICAL IMPRESSION: Patient referred to PT for Left knee pain. She does have some left knee instability with lateral patella tracking and overall hip weakness. MD impression is Impression is "left patella chondromalacia.  I feel her patellar retinaculi are also a little lax "MD recommendations are "Needs 1 session to learn home exercises for quad strengthening, patellar maltracking, chondromalacia patella.  Educate on proper kinesiotaping." We will plan for one time visit at this time per MD recommendations and she showed good understanding of HEP and KT taping.   OBJECTIVE IMPAIRMENTS: decreased activity tolerance, difficulty walking, decreased balance,  decreased strength, impaired flexibility,, and pain.  ACTIVITY LIMITATIONS:  lifting, prolonged running or locomotion, prolonged standing activity PERSONAL FACTORS: previous knee dislocation are also affecting patient's functional outcome.  REHAB POTENTIAL: Excellent  CLINICAL DECISION MAKING: Stable/uncomplicated  EVALUATION COMPLEXITY: Low    GOALS: Short term PT Goals Target date: 09/22/22 Pt will show good understanding of HEP. Baseline:  Goal status: MET today  Long term PT goals not necessary as this is one time visit.  PLAN: PT FREQUENCY: 1 visit only   PLANNED INTERVENTIONS (unless contraindicated): aquatic PT,  Canalith repositioning, cryotherapy, Electrical stimulation, Iontophoresis with 4 mg/ml dexamethasome, Moist heat, traction, Ultrasound, gait training, Therapeutic exercise, balance training, neuromuscular re-education, patient/family education, prosthetic training, manual techniques, passive ROM, dry needling, taping, vasopnuematic device, vestibular, spinal manipulations, joint manipulations  PLAN FOR NEXT SESSION: one time visit only for HEP. She will call back if needed.     April Manson, PT,DPT 09/22/2022, 12:02 PM

## 2023-03-23 ENCOUNTER — Emergency Department (HOSPITAL_BASED_OUTPATIENT_CLINIC_OR_DEPARTMENT_OTHER)
Admission: EM | Admit: 2023-03-23 | Discharge: 2023-03-23 | Disposition: A | Discharge status: Home / Self Care | Payer: Commercial Managed Care - PPO | Attending: Emergency Medicine | Admitting: Emergency Medicine

## 2023-03-23 ENCOUNTER — Encounter (HOSPITAL_BASED_OUTPATIENT_CLINIC_OR_DEPARTMENT_OTHER): Payer: Self-pay

## 2023-03-23 ENCOUNTER — Other Ambulatory Visit: Payer: Self-pay

## 2023-03-23 DIAGNOSIS — Z8616 Personal history of COVID-19: Secondary | ICD-10-CM | POA: Insufficient documentation

## 2023-03-23 DIAGNOSIS — R112 Nausea with vomiting, unspecified: Secondary | ICD-10-CM | POA: Diagnosis present

## 2023-03-23 DIAGNOSIS — R197 Diarrhea, unspecified: Secondary | ICD-10-CM | POA: Insufficient documentation

## 2023-03-23 LAB — COMPREHENSIVE METABOLIC PANEL
ALT: 8 U/L (ref 0–44)
AST: 11 U/L — ABNORMAL LOW (ref 15–41)
Albumin: 4.2 g/dL (ref 3.5–5.0)
Alkaline Phosphatase: 46 U/L (ref 38–126)
Anion gap: 6 (ref 5–15)
BUN: 17 mg/dL (ref 6–20)
CO2: 25 mmol/L (ref 22–32)
Calcium: 8.2 mg/dL — ABNORMAL LOW (ref 8.9–10.3)
Chloride: 110 mmol/L (ref 98–111)
Creatinine, Ser: 1.04 mg/dL — ABNORMAL HIGH (ref 0.44–1.00)
GFR, Estimated: >60 mL/min (ref >60)
Glucose, Bld: 107 mg/dL — ABNORMAL HIGH (ref 70–99)
Potassium: 4 mmol/L (ref 3.5–5.1)
Sodium: 141 mmol/L (ref 135–145)
Total Bilirubin: 0.3 mg/dL (ref <1.2)
Total Protein: 6.5 g/dL (ref 6.5–8.1)

## 2023-03-23 LAB — CBC WITH DIFFERENTIAL/PLATELET
Abs Immature Granulocytes: 0.02 10*3/uL (ref 0.00–0.07)
Basophils Absolute: 0.1 10*3/uL (ref 0.0–0.1)
Basophils Relative: 1 %
Eosinophils Absolute: 0.1 10*3/uL (ref 0.0–0.5)
Eosinophils Relative: 2 %
HCT: 34.6 % — ABNORMAL LOW (ref 36.0–46.0)
Hemoglobin: 10.7 g/dL — ABNORMAL LOW (ref 12.0–15.0)
Immature Granulocytes: 0 %
Lymphocytes Relative: 16 %
Lymphs Abs: 1 10*3/uL (ref 0.7–4.0)
MCH: 28 pg (ref 26.0–34.0)
MCHC: 30.9 g/dL (ref 30.0–36.0)
MCV: 90.6 fL (ref 80.0–100.0)
Monocytes Absolute: 0.4 10*3/uL (ref 0.1–1.0)
Monocytes Relative: 7 %
Neutro Abs: 4.8 10*3/uL (ref 1.7–7.7)
Neutrophils Relative %: 74 %
Platelets: 248 10*3/uL (ref 150–400)
RBC: 3.82 MIL/uL — ABNORMAL LOW (ref 3.87–5.11)
RDW: 16.7 % — ABNORMAL HIGH (ref 11.5–15.5)
WBC: 6.4 10*3/uL (ref 4.0–10.5)
nRBC: 0 % (ref 0.0–0.2)

## 2023-03-23 LAB — URINALYSIS, ROUTINE W REFLEX MICROSCOPIC
Bilirubin Urine: NEGATIVE
Glucose, UA: NEGATIVE mg/dL
Hgb urine dipstick: NEGATIVE
Ketones, ur: NEGATIVE mg/dL
Leukocytes,Ua: NEGATIVE
Nitrite: NEGATIVE
Protein, ur: NEGATIVE mg/dL
Specific Gravity, Urine: <1.005 — ABNORMAL LOW (ref 1.005–1.030)
pH: 7 (ref 5.0–8.0)

## 2023-03-23 LAB — LIPASE, BLOOD: Lipase: 19 U/L (ref 11–51)

## 2023-03-23 MED ORDER — SODIUM CHLORIDE 0.9 % IV SOLN
12.5000 mg | Freq: Four times a day (QID) | INTRAVENOUS | Status: DC | PRN
  Administered 2023-03-23: 12.5 mg via INTRAVENOUS
  Filled 2023-03-23: qty 0.5

## 2023-03-23 MED ORDER — SODIUM CHLORIDE 0.9 % IV BOLUS
1000.0000 mL | Freq: Once | INTRAVENOUS | Status: AC
  Administered 2023-03-23: 1000 mL via INTRAVENOUS

## 2023-03-23 MED ORDER — PROMETHAZINE HCL 25 MG PO TABS: 25.0000 mg | ORAL_TABLET | Freq: Four times a day (QID) | ORAL | 0 refills | Status: AC | PRN

## 2023-03-23 MED ORDER — PROMETHAZINE HCL 25 MG/ML IJ SOLN
INTRAMUSCULAR | Status: AC
  Filled 2023-03-23: qty 1

## 2023-03-23 NOTE — Discharge Instructions (Addendum)
Please read and follow all provided instructions.  Your diagnoses today include:  1. Nausea vomiting and diarrhea     TTests performed today include: Blood cell counts and platelets: Mild anemia otherwise no problems Kidney and liver function tests: Slightly elevated creatinine Pancreas function test (called lipase) Urine test to look for infection: No infection Vital signs. See below for your results today.   Medications prescribed:  Phenergan (promethazine) - for nausea and vomiting  Take any prescribed medications only as directed.  Home care instructions:  Follow any educational materials contained in this packet.  Your abdominal pain, nausea, vomiting, and diarrhea may be caused by a viral gastroenteritis also called 'stomach flu'. You should rest for the next several days. Keep drinking plenty of fluids and use the medicine for nausea as directed.   Drink clear liquids for the next 24 hours and introduce solid foods slowly after 24 hours using the b.r.a.t. diet (Bananas, Rice, Applesauce, Toast, Yogurt).    Follow-up instructions: Please follow-up with your primary care provider in the next 3 days for further evaluation of your symptoms. If you are not feeling better in 48-72 hours you may have a condition that is more serious and you need re-evaluation.   Return instructions:  SEEK IMMEDIATE MEDICAL ATTENTION IF: If you have pain that does not go away or becomes severe  A temperature above 101F develops  Repeated vomiting occurs (multiple episodes)  If you have pain that becomes localized to portions of the abdomen. The right side could possibly be appendicitis. In an adult, the left lower portion of the abdomen could be colitis or diverticulitis.  Blood is being passed in stools or vomit (bright red or black tarry stools)  You develop chest pain, difficulty breathing, dizziness or fainting, or become confused, poorly responsive, or inconsolable (young children) If you have  any other emergent concerns regarding your health  Additional Information: Abdominal (belly) pain can be caused by many things. Your caregiver performed an examination and possibly ordered blood/urine tests and imaging (CT scan, x-rays, ultrasound). Many cases can be observed and treated at home after initial evaluation in the emergency department. Even though you are being discharged home, abdominal pain can be unpredictable. Therefore, you need a repeated exam if your pain does not resolve, returns, or worsens. Most patients with abdominal pain don't have to be admitted to the hospital or have surgery, but serious problems like appendicitis and gallbladder attacks can start out as nonspecific pain. Many abdominal conditions cannot be diagnosed in one visit, so follow-up evaluations are very important.  Your vital signs today were: BP 120/64   Pulse 69   Temp 98.6 F (37 C) (Oral)   Resp 18   Ht 5\' 1"  (1.549 m)   Wt 49.9 kg   LMP 01/08/2013   SpO2 100%   BMI 20.78 kg/m  If your blood pressure (bp) was elevated above 135/85 this visit, please have this repeated by your doctor within one month. --------------

## 2023-03-23 NOTE — ED Provider Notes (Signed)
[place] EMERGENCY DEPARTMENT AT [facility] Provider Note   CSN: [id number] Arrival date & time: 03/23/23  1748     History  Chief Complaint  Patient presents with   Emesis    Kelsey Archer is a 52 y.o. female.  Patient with history of C-section, no other abdominal surgeries presents to the emergency department for evaluation of acute onset onset nausea, vomiting with diarrhea starting today.  Patient began having respiratory symptoms 1 week ago and was diagnosed with COVID 3 days ago.  Overall she is doing better in this regard.  He has been taking Mucinex at home.  This afternoon she developed multiple episodes of nonbloody nonbilious vomiting and multiple episodes of watery nonbloody diarrhea.  Last episode of vomiting was 2 hours ago.  No significant abdominal pain.  No fevers.  No dysuria, hematuria, increase frequency or urgency.  No chest pain or shortness of breath.  She denies known sick contacts.  Her and her husband both had the same meal today and he has not been having symptoms.  Patient was transported by EMS.  She took a Zofran ODT at home and was given 4 additional milligrams of Zofran IV en route as well as a fluid bolus.  She continues to have some nausea.       Home Medications Prior to Admission medications   Medication Sig Start Date End Date Taking? Authorizing Provider  albuterol (VENTOLIN HFA) 108 (90 Base) MCG/ACT inhaler albuterol sulfate HFA 90 mcg/actuation aerosol inhaler  INHALE 2 PUFFS EVERY 4 6 HOURS AS NEEDED COUGH/WHEEZE 90 DAYS 08/07/19   [provider]  buPROPion (WELLBUTRIN XL) 300 MG 24 hr tablet Take by mouth. 09/02/15   [provider]  cetirizine (ZYRTEC) 10 MG tablet Take 10 mg by mouth daily.    [provider]  diazepam (VALIUM) 2 MG tablet Take 2 mg by mouth 2 (two) times daily as needed. 03/24/20   [provider]  EPINEPHrine 0.3 mg/0.3 mL IJ SOAJ injection epinephrine 0.3 mg/0.3 mL  injection, auto-injector  INJECT INTO OUTER THIGH AS NEEDED FOR ALLERGIC REACTION 10/21/16   [provider]  Estradiol-Estriol-Progesterone (BIEST/PROGESTERONE) CREA compounded medication  BIEST 5/50.75ML/ TESTOSTERONE 0.5MG /ML  3 CLICKS DAILY    [provider]  Fluticasone Propionate (FLONASE NA) Place 1 spray into the nose daily.    [provider]  metaxalone (SKELAXIN) 800 MG tablet Take 1 tablet (800 mg total) by mouth 3 (three) times daily. 02/27/21   Tarry Kos, MD  Misc Natural Products (PROGESTERONE EX) Apply 1 application topically daily.    [provider]  naproxen (NAPROSYN) 500 MG tablet Take 1 tablet (500 mg total) by mouth 2 (two) times daily with a meal. 02/27/21   Tarry Kos, MD  Omega-3 Fatty Acids (FISH OIL) 1000 MG CAPS Fish Oil    [provider]  omeprazole (PRILOSEC) 40 MG capsule omeprazole 40 mg capsule,delayed release  TAKE 1 CAPSULE (40 MG TOTAL) BY MOUTH 2 (TWO) TIMES DAILY BEFORE MEALS 10/03/19   [provider]  predniSONE (STERAPRED UNI-PAK 21 TAB) 10 MG (21) TBPK tablet Take as directed 02/27/21   Tarry Kos, MD  progesterone (PROMETRIUM) 100 MG capsule Take 100 mg by mouth daily.    [provider]  PROGESTERONE PO Take by mouth.    [provider]  VITAMIN D PO Vitamin D    [provider]      Allergies    Patient has  no active allergies.    Review of Systems   Review of Systems  Physical Exam Updated Vital Signs BP 122/79 (BP Location: Right Arm)   Pulse 72   Temp 98.6 F (37 C) (Oral)   Resp 16   Ht 5\' 1"  (1.549 m)   Wt 49.9 kg   LMP 01/08/2013   SpO2 100%   BMI 20.78 kg/m  Physical Exam Vitals and nursing note reviewed.  Constitutional:      General: She is not in acute distress.    Appearance: She is well-developed.  HENT:     Head: Normocephalic and atraumatic.     Right Ear: External ear normal.     Left Ear: External ear normal.     Nose:  Nose normal.  Eyes:     Conjunctiva/sclera: Conjunctivae normal.  Cardiovascular:     Rate and Rhythm: Normal rate and regular rhythm.     Heart sounds: No murmur heard. Pulmonary:     Effort: No respiratory distress.     Breath sounds: No wheezing, rhonchi or rales.  Abdominal:     Palpations: Abdomen is soft.     Tenderness: There is no abdominal tenderness. There is no guarding or rebound.  Musculoskeletal:     Cervical back: Normal range of motion and neck supple.     Right lower leg: No edema.     Left lower leg: No edema.  Skin:    General: Skin is warm and dry.     Findings: No rash.  Neurological:     General: No focal deficit present.     Mental Status: She is alert. Mental status is at baseline.     Motor: No weakness.  Psychiatric:        Mood and Affect: Mood normal.     ED Results / Procedures / Treatments   Labs (all labs ordered are listed, but only abnormal results are displayed) Labs Reviewed  CBC WITH DIFFERENTIAL/PLATELET - Abnormal; Notable for the following components:      Result Value   RBC 3.82 (*)    Hemoglobin 10.7 (*)    HCT 34.6 (*)    RDW 16.7 (*)    All other components within normal limits  COMPREHENSIVE METABOLIC PANEL - Abnormal; Notable for the following components:   Glucose, Bld 107 (*)    Creatinine, Ser 1.04 (*)    Calcium 8.2 (*)    AST 11 (*)    All other components within normal limits  URINALYSIS, ROUTINE W REFLEX MICROSCOPIC - Abnormal; Notable for the following components:   Color, Urine COLORLESS (*)    Specific Gravity, Urine <1.005 (*)    All other components within normal limits  LIPASE, BLOOD    EKG None  Radiology No results found.  Procedures Procedures    Medications Ordered in ED Medications  promethazine (PHENERGAN) 12.5 mg in sodium chloride 0.9 % 50 mL IVPB (has no administration in time range)    ED Course/ Medical Decision Making/ A&P    Patient seen and examined. History obtained directly  from patient.   Labs/EKG: Ordered CBC, CMP, lipase, UA.  Imaging: None ordered  Medications/Fluids: Ordered: IV Phenergan.   Most recent vital signs reviewed and are as follows: BP 122/79 (BP Location: Right Arm)   Pulse 72   Temp 98.6 F (37 C) (Oral)   Resp 16   Ht 5\' 1"  (1.549 m)   Wt 49.9 kg   LMP 01/08/2013   SpO2 100%  BMI 20.78 kg/m   Initial impression: Gastroenteritis-like symptoms in setting of recent respiratory COVID infection.  Reassuring abdominal exam.  8:54 PM Reassessment performed. Patient appears comfortable.  She states that she is feeling better.  She is tolerating oral fluids at bedside.  Labs personally reviewed and interpreted including: CBC with normal white blood cell count, mild anemia at 10.7 and unclear baseline; CMP with mildly elevated creatinine at 1.04 otherwise glucose 107 with normal electrolytes; lactate normal; UA without compelling signs of infection.  Reviewed pertinent lab work and imaging with patient at bedside. Questions answered.   Most current vital signs reviewed and are as follows: BP 120/64   Pulse 69   Temp 98.6 F (37 C) (Oral)   Resp 18   Ht 5\' 1"  (1.549 m)   Wt 49.9 kg   LMP 01/08/2013   SpO2 100%   BMI 20.78 kg/m   Plan: Discharge to home.   Prescriptions written for: Promethazine  Other home care instructions discussed: Clear liquid diet for 12 hours and then slow advancement  ED return instructions discussed: The patient was urged to return to the Emergency Department immediately with worsening of current symptoms, worsening abdominal pain, persistent vomiting, blood noted in stools, fever, or any other concerns. The patient verbalized understanding.   Follow-up instructions discussed: Patient encouraged to follow-up with their PCP in 2-3 days if not improving.                                   Medical Decision Making Amount and/or Complexity of Data Reviewed Labs: ordered.  Risk Prescription drug  management.   For this patient's complaint of abdominal pain, the following conditions were considered on the differential diagnosis: gastritis/PUD, enteritis/duodenitis, appendicitis, cholelithiasis/cholecystitis, cholangitis, pancreatitis, ruptured viscus, colitis, diverticulitis, small/large bowel obstruction, proctitis, cystitis, pyelonephritis, ureteral colic, aortic dissection, aortic aneurysm. In women, ectopic pregnancy, pelvic inflammatory disease, ovarian cysts, and tubo-ovarian abscess were also considered. Atypical chest etiologies were also considered including ACS, PE, and pneumonia.  The patient's vital signs, pertinent lab work and imaging were reviewed and interpreted as discussed in the ED course. Hospitalization was considered for further testing, treatments, or serial exams/observation. However as patient is well-appearing, has a stable exam, and reassuring studies today, I do not feel that they warrant admission at this time. This plan was discussed with the patient who verbalizes agreement and comfort with this plan and seems reliable and able to return to the Emergency Department with worsening or changing symptoms.          Final Clinical Impression(s) / ED Diagnoses Final diagnoses:  Nausea vomiting and diarrhea    Rx / DC Orders ED Discharge Orders          Ordered    promethazine (PHENERGAN) 25 MG tablet  Every 6 hours PRN        03/23/23 2050              Renne Crigler, PA-C 03/23/23 2102    Rondel Baton, MD 03/25/23 1323

## 2023-03-23 NOTE — ED Triage Notes (Signed)
Transported via [facility] from home for eval of nausea, vomiting, and diarrhea. Sudden onset approx at 1400 today. Started feeling pooly last Friday with cough and chest congestion, Positive covid PCR test Tuesday.  EMS placed IV 18 LAC and administered NS 500 ml and Zofran 4 mg IV.  Positive orthostatics with dizziness prior to IV fluid administration per EMS.

## 2023-04-30 ENCOUNTER — Other Ambulatory Visit: Payer: Self-pay | Admitting: Orthopaedic Surgery

## 2023-04-30 MED ORDER — PREDNISONE 10 MG (21) PO TBPK: ORAL_TABLET | ORAL | 3 refills | Status: AC

## 2023-04-30 MED ORDER — METHOCARBAMOL 500 MG PO TABS: 500.0000 mg | ORAL_TABLET | Freq: Four times a day (QID) | ORAL | 2 refills | Status: DC | PRN

## 2023-06-15 ENCOUNTER — Encounter: Payer: Commercial Managed Care - PPO | Admitting: Orthopaedic Surgery

## 2023-12-28 ENCOUNTER — Other Ambulatory Visit (INDEPENDENT_AMBULATORY_CARE_PROVIDER_SITE_OTHER): Payer: Self-pay

## 2023-12-28 ENCOUNTER — Ambulatory Visit (INDEPENDENT_AMBULATORY_CARE_PROVIDER_SITE_OTHER): Admitting: Orthopaedic Surgery

## 2023-12-28 DIAGNOSIS — M545 Low back pain, unspecified: Secondary | ICD-10-CM

## 2023-12-28 MED ORDER — METHOCARBAMOL 500 MG PO TABS: 500.0000 mg | ORAL_TABLET | Freq: Four times a day (QID) | ORAL | 2 refills | Status: AC | PRN

## 2023-12-28 MED ORDER — METHYLPREDNISOLONE 4 MG PO TBPK: ORAL_TABLET | ORAL | 0 refills | Status: AC

## 2023-12-28 NOTE — Progress Notes (Signed)
 Office Visit Note   Patient: Kelsey Archer           Date of Birth: [birth year]           MRN: [id number] Visit Date: 12/28/2023              Requested by: Aletha Bene, MD [address] [place],  [place] [address] PCP: Aletha Bene, MD   Assessment & Plan: Visit Diagnoses:  1. Low back pain, unspecified back pain laterality, unspecified chronicity, unspecified whether sciatica present     Plan: History of Present Illness Kelsey Archer is a 53 year old female who presents with intermittent low back pain on the right side.  She experiences intermittent back pain primarily on the right side, with occasional discomfort on the left. The pain is persistent but not severe and has lasted longer than previous episodes, which typically resolve within a month. There is no radiation of pain down her legs, numbness, or tingling. She remains physically active and engages in regular exercise. She frequently travels between [place]  and her current location, often staying for two to three weeks at a time, which may impact her ability to seek consistent physical therapy. She has previously been prescribed Robaxin  and Skelaxin  for similar issues but does not recall their effectiveness. She prefers medications with fewer side effects due to sensitivity.  Physical Exam MUSCULOSKELETAL: Right hip with normal range of motion, non-tender. Right hip FABER negative. SI joints non-tender on palpation.  Adams forward bending test is normal..  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 No evidence of radiculopathy. - Prescribed steroid dose pack for inflammation. - Prescribed Robaxin  (methocarbamol ) with option for half tablet if sensitive to side effects. - Referred to physical therapy for core  strengthening. - Advised against exercises with significant spinal flexion and seated lifting. - Recommended running on softer surfaces to reduce spinal impact. - Provided prescriptions with refills for flare-ups. - Suggested finding physical therapy offices in [place]  and local area for travel convenience.  Follow-Up Instructions: No follow-ups on file.   Orders:  Orders Placed This Encounter  Procedures   XR Lumbar Spine 2-3 Views   Meds ordered this encounter  Medications   methylPREDNISolone  (MEDROL  DOSEPAK) 4 MG TBPK tablet    Sig: Take as directed    Dispense:  21 tablet    Refill:  0   methocarbamol  (ROBAXIN ) 500 MG tablet    Sig: Take 1 tablet (500 mg total) by mouth every 6 (six) hours as needed for muscle spasms.    Dispense:  30 tablet    Refill:  2      Procedures: No procedures performed   Clinical Data: No additional findings.   Subjective: Chief Complaint  Patient presents with   Lower Back - Pain    HPI  Review of Systems  Constitutional: Negative.   HENT: Negative.    Eyes: Negative.   Respiratory: Negative.    Cardiovascular: Negative.   Endocrine: Negative.   Musculoskeletal: Negative.   Neurological: Negative.   Hematological: Negative.   Psychiatric/Behavioral: Negative.    All other systems reviewed and are negative.    Objective: Vital Signs: LMP 01/08/2013   Physical Exam Vitals and nursing note reviewed.  Constitutional:      Appearance: She is well-developed.  HENT:  Head: Atraumatic.     Nose: Nose normal.  Eyes:     Extraocular Movements: Extraocular movements intact.  Cardiovascular:     Pulses: Normal pulses.  Pulmonary:     Effort: Pulmonary effort is normal.  Abdominal:     Palpations: Abdomen is soft.  Musculoskeletal:     Cervical back: Neck supple.  Skin:    General: Skin is warm.     Capillary Refill: Capillary refill takes less than 2 seconds.  Neurological:     Mental Status: She is alert.  Mental status is at baseline.  Psychiatric:        Behavior: Behavior normal.        Thought Content: Thought content normal.        Judgment: Judgment normal.     Ortho Exam  Specialty Comments:  No specialty comments available.  Imaging: XR Lumbar Spine 2-3 Views Result Date: 12/28/2023 X-rays of the lumbar spine show a mild scoliotic curve.  Diffuse facet disease.    PMFS History: Patient Active Problem List   Diagnosis Date Noted   Palpitation 08/06/2012   Past Medical History:  Diagnosis Date   Cancer (HCC)    basel cell- nose- 2018    Palpitations     Family History  Problem Relation Age of Onset   Breast cancer Mother    Breast cancer Maternal Grandmother    BRCA 1/2 Neg Hx     Past Surgical History:  Procedure Laterality Date   AUGMENTATION MAMMAPLASTY Bilateral    silicone/ retropectoral 2010   BREAST SURGERY     implants   CESAREAN SECTION     TONSILLECTOMY     Social History   Occupational History   Not on file  Tobacco Use   Smoking status: Never   Smokeless tobacco: Never  Vaping Use   Vaping status: Never Used  Substance and Sexual Activity   Alcohol use: Yes    Alcohol/week: 14.0 standard drinks of alcohol    Types: 14 Glasses of wine per week   Drug use: No   Sexual activity: Not on file

## 2024-02-12 ENCOUNTER — Encounter: Payer: Self-pay | Admitting: Radiology
